# Patient Record
Sex: Female | Born: 1955 | Race: White | Hispanic: No | Marital: Married | Smoking: Never smoker
Health system: Southern US, Community
[De-identification: ages and names within clinical notes are randomized; demographics above are authoritative.]

## PROBLEM LIST (undated history)

## (undated) DIAGNOSIS — Z973 Presence of spectacles and contact lenses: Secondary | ICD-10-CM

## (undated) DIAGNOSIS — R350 Frequency of micturition: Secondary | ICD-10-CM

## (undated) DIAGNOSIS — R51 Headache: Secondary | ICD-10-CM

## (undated) DIAGNOSIS — C801 Malignant (primary) neoplasm, unspecified: Secondary | ICD-10-CM

## (undated) DIAGNOSIS — Z8489 Family history of other specified conditions: Secondary | ICD-10-CM

## (undated) DIAGNOSIS — F32A Depression, unspecified: Secondary | ICD-10-CM

## (undated) DIAGNOSIS — M1990 Unspecified osteoarthritis, unspecified site: Secondary | ICD-10-CM

## (undated) DIAGNOSIS — G2581 Restless legs syndrome: Secondary | ICD-10-CM

## (undated) DIAGNOSIS — N289 Disorder of kidney and ureter, unspecified: Secondary | ICD-10-CM

## (undated) DIAGNOSIS — F319 Bipolar disorder, unspecified: Secondary | ICD-10-CM

## (undated) DIAGNOSIS — Z8701 Personal history of pneumonia (recurrent): Secondary | ICD-10-CM

## (undated) DIAGNOSIS — Z98811 Dental restoration status: Secondary | ICD-10-CM

## (undated) DIAGNOSIS — N390 Urinary tract infection, site not specified: Secondary | ICD-10-CM

## (undated) DIAGNOSIS — F329 Major depressive disorder, single episode, unspecified: Secondary | ICD-10-CM

## (undated) DIAGNOSIS — K219 Gastro-esophageal reflux disease without esophagitis: Secondary | ICD-10-CM

## (undated) DIAGNOSIS — R519 Headache, unspecified: Secondary | ICD-10-CM

## (undated) DIAGNOSIS — F988 Other specified behavioral and emotional disorders with onset usually occurring in childhood and adolescence: Secondary | ICD-10-CM

## (undated) DIAGNOSIS — F419 Anxiety disorder, unspecified: Secondary | ICD-10-CM

## (undated) HISTORY — PX: MOHS SURGERY: SUR867

---

## 2015-04-17 HISTORY — PX: EYE SURGERY: SHX253

## 2015-05-22 ENCOUNTER — Other Ambulatory Visit: Payer: Self-pay | Admitting: Neurosurgery

## 2015-05-22 ENCOUNTER — Ambulatory Visit
Admission: RE | Admit: 2015-05-22 | Discharge: 2015-05-22 | Disposition: A | Discharge status: Home / Self Care | Payer: BC Managed Care – PPO | Source: Ambulatory Visit | Attending: Neurosurgery | Admitting: Neurosurgery

## 2015-05-22 DIAGNOSIS — M4317 Spondylolisthesis, lumbosacral region: Secondary | ICD-10-CM

## 2015-05-22 MED ORDER — IOHEXOL 180 MG/ML  SOLN
18.0000 mL | Freq: Once | INTRAMUSCULAR | Status: AC | PRN
  Administered 2015-05-22: 18 mL via INTRATHECAL

## 2015-05-22 MED ORDER — DIAZEPAM 5 MG PO TABS
10.0000 mg | ORAL_TABLET | Freq: Once | ORAL | Status: AC
  Administered 2015-05-22: 10 mg via ORAL

## 2015-05-22 NOTE — Progress Notes (Signed)
Patient states she has been off Adderall for at least the past two days.  jkl

## 2015-05-22 NOTE — Discharge Instructions (Signed)
Myelogram Discharge Instructions  1. Go home and rest quietly for the next 24 hours.  It is important to lie flat for the next 24 hours.  Get up only to go to the restroom.  You may lie in the bed or on a couch on your back, your stomach, your left side or your right side.  You may have one pillow under your head.  You may have pillows between your knees while you are on your side or under your knees while you are on your back.  2. DO NOT drive today.  Recline the seat as far back as it will go, while still wearing your seat belt, on the way home.  3. You may get up to go to the bathroom as needed.  You may sit up for 10 minutes to eat.  You may resume your normal diet and medications unless otherwise indicated.  Drink plenty of extra fluids today and tomorrow.  4. The incidence of a spinal headache with nausea and/or vomiting is about 5% (one in 20 patients).  If you develop a headache, lie flat and drink plenty of fluids until the headache goes away.  Caffeinated beverages may be helpful.  If you develop severe nausea and vomiting or a headache that does not go away with flat bed rest, call [phone].  5. You may resume normal activities after your 24 hours of bed rest is over; however, do not exert yourself strongly or do any heavy lifting tomorrow.  6. Call your physician for a follow-up appointment.   You may resume Adderall on Saturday, May 23, 2015 after 11:00am.

## 2015-05-26 ENCOUNTER — Other Ambulatory Visit: Payer: Self-pay | Admitting: Neurosurgery

## 2015-05-28 NOTE — Pre-Procedure Instructions (Signed)
    Kelly Decker  05/28/2015      [id number] - [place], [place] [address] [place] [place] [address] Phone: [phone] Fax: [phone]  [place], [address] [place] [address] Phone: [phone] Fax: [phone]    Your procedure is scheduled on Tuesday, August 16th, 2016.   Report to [facility] Admitting at 10:00 A.M.  Call this number if you have problems the morning of surgery:  [phone]   Remember:  Do not eat food or drink liquids after midnight.   Take these medicines the morning of surgery with A SIP OF WATER: Amphetamine-dextroamphetamine (Adderall), clonazepam (Klonopin), Omeprazole (Prilosec).   Stop taking: NSAIDS, Aspirin, Aleve, Naproxen, Ibuprofen, BC's, Goody's, fish oil, all herbal medications, and all vitamins.    Do not wear jewelry, make-up or nail polish.  Do not wear lotions, powders, or perfumes.  You may wear deodorant.  Do not shave 48 hours prior to surgery.    Do not bring valuables to the hospital.  [facility] is not responsible for any belongings or valuables.  Contacts, dentures or bridgework may not be worn into surgery.  Leave your suitcase in the car.  After surgery it may be brought to your room.  For patients admitted to the hospital, discharge time will be determined by your treatment team.  Patients discharged the day of surgery will not be allowed to drive home.   Special instructions:  See attached.   Please read over the following fact sheets that you were given. Pain Booklet, Coughing and Deep Breathing, Blood Transfusion Information, MRSA Information and Surgical Site Infection Prevention

## 2015-05-29 ENCOUNTER — Encounter (HOSPITAL_COMMUNITY)
Admission: RE | Admit: 2015-05-29 | Discharge: 2015-05-29 | Disposition: A | Discharge status: Home / Self Care | Payer: BC Managed Care – PPO | Source: Ambulatory Visit | Attending: Neurosurgery | Admitting: Neurosurgery

## 2015-05-29 ENCOUNTER — Encounter (HOSPITAL_COMMUNITY): Payer: Self-pay

## 2015-05-29 DIAGNOSIS — M4317 Spondylolisthesis, lumbosacral region: Secondary | ICD-10-CM | POA: Insufficient documentation

## 2015-05-29 DIAGNOSIS — Z0183 Encounter for blood typing: Secondary | ICD-10-CM | POA: Diagnosis not present

## 2015-05-29 DIAGNOSIS — Z01812 Encounter for preprocedural laboratory examination: Secondary | ICD-10-CM | POA: Insufficient documentation

## 2015-05-29 HISTORY — DX: Headache, unspecified: R51.9

## 2015-05-29 HISTORY — DX: Headache: R51

## 2015-05-29 HISTORY — DX: Malignant (primary) neoplasm, unspecified: C80.1

## 2015-05-29 HISTORY — DX: Major depressive disorder, single episode, unspecified: F32.9

## 2015-05-29 HISTORY — DX: Restless legs syndrome: G25.81

## 2015-05-29 HISTORY — DX: Anxiety disorder, unspecified: F41.9

## 2015-05-29 HISTORY — DX: Family history of other specified conditions: Z84.89

## 2015-05-29 HISTORY — DX: Dental restoration status: Z98.811

## 2015-05-29 HISTORY — DX: Frequency of micturition: R35.0

## 2015-05-29 HISTORY — DX: Presence of spectacles and contact lenses: Z97.3

## 2015-05-29 HISTORY — DX: Unspecified osteoarthritis, unspecified site: M19.90

## 2015-05-29 HISTORY — DX: Depression, unspecified: F32.A

## 2015-05-29 HISTORY — DX: Other specified behavioral and emotional disorders with onset usually occurring in childhood and adolescence: F98.8

## 2015-05-29 HISTORY — DX: Bipolar disorder, unspecified: F31.9

## 2015-05-29 HISTORY — DX: Personal history of pneumonia (recurrent): Z87.01

## 2015-05-29 HISTORY — DX: Gastro-esophageal reflux disease without esophagitis: K21.9

## 2015-05-29 HISTORY — DX: Urinary tract infection, site not specified: N39.0

## 2015-05-29 HISTORY — DX: Disorder of kidney and ureter, unspecified: N28.9

## 2015-05-29 LAB — COMPREHENSIVE METABOLIC PANEL
ALT: 20 U/L (ref 14–54)
ANION GAP: 7 (ref 5–15)
AST: 28 U/L (ref 15–41)
Albumin: 4.2 g/dL (ref 3.5–5.0)
Alkaline Phosphatase: 104 U/L (ref 38–126)
BUN: 15 mg/dL (ref 6–20)
CALCIUM: 9.2 mg/dL (ref 8.9–10.3)
CO2: 26 mmol/L (ref 22–32)
CREATININE: 0.87 mg/dL (ref 0.44–1.00)
Chloride: 107 mmol/L (ref 101–111)
GFR calc Af Amer: >60 mL/min (ref >60)
GFR calc non Af Amer: >60 mL/min (ref >60)
Glucose, Bld: 91 mg/dL (ref 65–99)
Potassium: 4.2 mmol/L (ref 3.5–5.1)
SODIUM: 140 mmol/L (ref 135–145)
TOTAL PROTEIN: 6.9 g/dL (ref 6.5–8.1)
Total Bilirubin: 1 mg/dL (ref 0.3–1.2)

## 2015-05-29 LAB — SURGICAL PCR SCREEN
MRSA, PCR: NEGATIVE
Staphylococcus aureus: NEGATIVE

## 2015-05-29 LAB — TYPE AND SCREEN
ABO/RH(D): O NEG
ANTIBODY SCREEN: NEGATIVE

## 2015-05-29 LAB — ABO/RH: ABO/RH(D): O NEG

## 2015-05-29 LAB — CBC
HEMATOCRIT: 42.1 % (ref 36.0–46.0)
HEMOGLOBIN: 13.8 g/dL (ref 12.0–15.0)
MCH: 29.6 pg (ref 26.0–34.0)
MCHC: 32.8 g/dL (ref 30.0–36.0)
MCV: 90.1 fL (ref 78.0–100.0)
PLATELETS: 347 10*3/uL (ref 150–400)
RBC: 4.67 MIL/uL (ref 3.87–5.11)
RDW: 14.5 % (ref 11.5–15.5)
WBC: 9 10*3/uL (ref 4.0–10.5)

## 2015-05-29 NOTE — Progress Notes (Signed)
PCP- Dr. Ashok Croon Cardio- denies EKG & CXR - denies Echo/Stress Test/Cardiac Cath - denies  Pt. Denies shortness of breath and any cardiac issues at PAT appointment.

## 2015-06-01 MED ORDER — VANCOMYCIN HCL IN DEXTROSE 1-5 GM/200ML-% IV SOLN
1000.0000 mg | INTRAVENOUS | Status: AC
  Administered 2015-06-02: 1000 mg via INTRAVENOUS
  Filled 2015-06-01: qty 200

## 2015-06-01 NOTE — H&P (Signed)
Kelly Decker is an 59 y.o. female.   Chief Complaint: right leg pain HPI: patient was seen in my office along with her husband because of lumbar pain with radiation to thr right leg, the pain is a burnning sensation ,constant, no better with medications.  Past Medical History  Diagnosis Date  . Dental crown present   . ADD (attention deficit disorder)   . Bipolar disorder   . Anxiety   . Restless leg syndrome   . GERD (gastroesophageal reflux disease)   . Ureteral disorder     as a child  . Recurrent UTI   . History of pneumonia   . Wears glasses   . Depression   . Urinary frequency   . Headache   . Family history of headaches   . Arthritis   . Cancer     right leg - skin cancer    Past Surgical History  Procedure Laterality Date  . Mohs surgery Right     right leg  . Eye surgery Left July 2016    Lens Implant with Femtosecond left eye; Intraocular Lens Implant    No family history on file. Social History:  reports that she has never smoked. She does not have any smokeless tobacco history on file. She reports that she drinks alcohol. She reports that she does not use illicit drugs.  Allergies:  Allergies  Allergen Reactions  . Other Other (See Comments)    Novacaine at the dentist "caused my heart rate to drop and made me pass out."  My dentist told me never to use this again.  05/22/15: Lidocaine was used during myelogram without incident.  . Procaine Other (See Comments)    Other reaction(s): Other VERY SLOW HEART RATE  . Tetanus Toxoids Other (See Comments)    Decreased heart rate Passed out  . Ciprofloxacin Hives and Rash    hives  . Gabapentin Hives and Rash  . Penicillins Hives and Rash  . Sulfa Antibiotics Rash and Hives  . Codeine Nausea Only  . Lac Bovis Diarrhea    ABDOMINAL PAIN    No prescriptions prior to admission    No results found for this or any previous visit (from the past 48 hour(s)). No results found.  Review of Systems   Constitutional: Negative.   HENT: Negative.   Eyes: Positive for blurred vision.  Respiratory: Negative.   Cardiovascular: Negative.   Gastrointestinal: Negative.   Genitourinary: Negative.   Musculoskeletal: Positive for back pain.  Skin: Negative.   Neurological: Positive for sensory change and focal weakness.  Endo/Heme/Allergies: Negative.   Psychiatric/Behavioral: Positive for depression.    There were no vitals taken for this visit. Physical Exam  Henet, nl. Neck, nlungs.clear cv, nl. Abdomen, nl. Extremities, nl. NEURO weakness of DF right foot. Sensory with burning feelings at l5-s1 dermatomes. Femoral stretch positive on the right. Dtr one plus. myrlo gram shows spondylolisthesis grade2 at l5s1 and at l4-5 spondylolisthesis up to 8 mms. Assessment/Plan Decompression and fusion at l45,l5s1 with cages and pedicle crews. Both are aware of risks and benefits  Renny Remer M 06/01/2015, 4:48 PM

## 2015-06-02 ENCOUNTER — Inpatient Hospital Stay (HOSPITAL_COMMUNITY): Payer: BC Managed Care – PPO

## 2015-06-02 ENCOUNTER — Inpatient Hospital Stay (HOSPITAL_COMMUNITY): Payer: BC Managed Care – PPO | Admitting: Anesthesiology

## 2015-06-02 ENCOUNTER — Encounter (HOSPITAL_COMMUNITY): Payer: Self-pay | Admitting: General Practice

## 2015-06-02 ENCOUNTER — Inpatient Hospital Stay (HOSPITAL_COMMUNITY)
Admission: AD | Admit: 2015-06-02 | Discharge: 2015-06-06 | DRG: 460 | Disposition: A | Discharge status: Home Health | Payer: BC Managed Care – PPO | Source: Ambulatory Visit | Attending: Neurosurgery | Admitting: Neurosurgery

## 2015-06-02 ENCOUNTER — Encounter (HOSPITAL_COMMUNITY)
Admission: AD | Disposition: A | Discharge status: Home Health | Payer: BC Managed Care – PPO | Source: Ambulatory Visit | Attending: Neurosurgery

## 2015-06-02 DIAGNOSIS — M4316 Spondylolisthesis, lumbar region: Secondary | ICD-10-CM | POA: Diagnosis present

## 2015-06-02 DIAGNOSIS — F988 Other specified behavioral and emotional disorders with onset usually occurring in childhood and adolescence: Secondary | ICD-10-CM | POA: Diagnosis present

## 2015-06-02 DIAGNOSIS — Z885 Allergy status to narcotic agent status: Secondary | ICD-10-CM | POA: Diagnosis not present

## 2015-06-02 DIAGNOSIS — F319 Bipolar disorder, unspecified: Secondary | ICD-10-CM | POA: Diagnosis present

## 2015-06-02 DIAGNOSIS — Z887 Allergy status to serum and vaccine status: Secondary | ICD-10-CM | POA: Diagnosis not present

## 2015-06-02 DIAGNOSIS — Q762 Congenital spondylolisthesis: Secondary | ICD-10-CM | POA: Diagnosis not present

## 2015-06-02 DIAGNOSIS — F419 Anxiety disorder, unspecified: Secondary | ICD-10-CM | POA: Diagnosis present

## 2015-06-02 DIAGNOSIS — K219 Gastro-esophageal reflux disease without esophagitis: Secondary | ICD-10-CM | POA: Diagnosis present

## 2015-06-02 DIAGNOSIS — M79604 Pain in right leg: Secondary | ICD-10-CM | POA: Diagnosis present

## 2015-06-02 DIAGNOSIS — Z882 Allergy status to sulfonamides status: Secondary | ICD-10-CM

## 2015-06-02 DIAGNOSIS — M532X7 Spinal instabilities, lumbosacral region: Secondary | ICD-10-CM | POA: Diagnosis present

## 2015-06-02 DIAGNOSIS — M5417 Radiculopathy, lumbosacral region: Secondary | ICD-10-CM | POA: Diagnosis present

## 2015-06-02 DIAGNOSIS — Z419 Encounter for procedure for purposes other than remedying health state, unspecified: Secondary | ICD-10-CM

## 2015-06-02 DIAGNOSIS — Z880 Allergy status to penicillin: Secondary | ICD-10-CM | POA: Diagnosis not present

## 2015-06-02 DIAGNOSIS — Z888 Allergy status to other drugs, medicaments and biological substances status: Secondary | ICD-10-CM

## 2015-06-02 DIAGNOSIS — G2581 Restless legs syndrome: Secondary | ICD-10-CM | POA: Diagnosis present

## 2015-06-02 SURGERY — POSTERIOR LUMBAR FUSION 2 LEVEL
Anesthesia: General

## 2015-06-02 MED ORDER — FENTANYL CITRATE (PF) 100 MCG/2ML IJ SOLN
INTRAMUSCULAR | Status: DC | PRN
  Administered 2015-06-02 (×2): 50 ug via INTRAVENOUS
  Administered 2015-06-02: 150 ug via INTRAVENOUS
  Administered 2015-06-02: 50 ug via INTRAVENOUS
  Administered 2015-06-02: 100 ug via INTRAVENOUS
  Administered 2015-06-02: 250 ug via INTRAVENOUS

## 2015-06-02 MED ORDER — MEPERIDINE HCL 25 MG/ML IJ SOLN: 6.2500 mg | INTRAMUSCULAR | Status: DC | PRN

## 2015-06-02 MED ORDER — ACETAMINOPHEN 325 MG PO TABS
650.0000 mg | ORAL_TABLET | ORAL | Status: DC | PRN
  Administered 2015-06-05 – 2015-06-06 (×7): 650 mg via ORAL
  Filled 2015-06-02 (×8): qty 2

## 2015-06-02 MED ORDER — MORPHINE SULFATE 1 MG/ML IV SOLN
INTRAVENOUS | Status: AC
  Filled 2015-06-02: qty 25

## 2015-06-02 MED ORDER — HYDROMORPHONE HCL 1 MG/ML IJ SOLN
INTRAMUSCULAR | Status: AC
  Administered 2015-06-02: 0.25 mg via INTRAVENOUS
  Filled 2015-06-02: qty 1

## 2015-06-02 MED ORDER — MECLIZINE HCL 12.5 MG PO TABS
12.5000 mg | ORAL_TABLET | Freq: Once | ORAL | Status: AC
  Administered 2015-06-02: 12.5 mg via ORAL
  Filled 2015-06-02 (×2): qty 1

## 2015-06-02 MED ORDER — SODIUM CHLORIDE 0.9 % IV SOLN: 250.0000 mL | INTRAVENOUS | Status: DC

## 2015-06-02 MED ORDER — GLYCOPYRROLATE 0.2 MG/ML IJ SOLN
INTRAMUSCULAR | Status: AC
  Filled 2015-06-02: qty 2

## 2015-06-02 MED ORDER — DEXAMETHASONE SODIUM PHOSPHATE 10 MG/ML IJ SOLN
INTRAMUSCULAR | Status: DC | PRN
  Administered 2015-06-02: 10 mg via INTRAVENOUS

## 2015-06-02 MED ORDER — NALOXONE HCL 0.4 MG/ML IJ SOLN
INTRAMUSCULAR | Status: AC
  Filled 2015-06-02: qty 1

## 2015-06-02 MED ORDER — ONDANSETRON HCL 4 MG/2ML IJ SOLN
INTRAMUSCULAR | Status: DC | PRN
  Administered 2015-06-02: 4 mg via INTRAVENOUS

## 2015-06-02 MED ORDER — OXYCODONE-ACETAMINOPHEN 5-325 MG PO TABS
2.0000 | ORAL_TABLET | ORAL | Status: DC | PRN
  Administered 2015-06-02 – 2015-06-03 (×5): 2 via ORAL
  Administered 2015-06-04: 1 via ORAL
  Filled 2015-06-02 (×6): qty 2

## 2015-06-02 MED ORDER — AMPHETAMINE-DEXTROAMPHETAMINE 10 MG PO TABS
30.0000 mg | ORAL_TABLET | Freq: Every day | ORAL | Status: DC
  Administered 2015-06-06: 30 mg via ORAL
  Filled 2015-06-02 (×3): qty 3

## 2015-06-02 MED ORDER — SCOPOLAMINE 1 MG/3DAYS TD PT72
1.0000 | MEDICATED_PATCH | Freq: Once | TRANSDERMAL | Status: AC
  Administered 2015-06-02: 1.5 mg via TRANSDERMAL
  Filled 2015-06-02: qty 1

## 2015-06-02 MED ORDER — DIAZEPAM 5 MG PO TABS
5.0000 mg | ORAL_TABLET | Freq: Four times a day (QID) | ORAL | Status: DC | PRN
  Administered 2015-06-04: 5 mg via ORAL
  Filled 2015-06-02: qty 1

## 2015-06-02 MED ORDER — AMPHETAMINE-DEXTROAMPHETAMINE 30 MG PO TABS: 30.0000 mg | ORAL_TABLET | Freq: Every day | ORAL | Status: DC

## 2015-06-02 MED ORDER — PROPOFOL 10 MG/ML IV BOLUS
INTRAVENOUS | Status: AC
  Filled 2015-06-02: qty 20

## 2015-06-02 MED ORDER — HYDROMORPHONE HCL 1 MG/ML IJ SOLN
0.2500 mg | INTRAMUSCULAR | Status: DC | PRN
  Administered 2015-06-02: 0.25 mg via INTRAVENOUS

## 2015-06-02 MED ORDER — PROMETHAZINE HCL 25 MG/ML IJ SOLN
6.2500 mg | INTRAMUSCULAR | Status: DC | PRN
Start: 2015-06-02 — End: 2015-06-02

## 2015-06-02 MED ORDER — PHENYLEPHRINE HCL 10 MG/ML IJ SOLN
10.0000 mg | INTRAVENOUS | Status: DC | PRN
  Administered 2015-06-02: 10 ug/min via INTRAVENOUS

## 2015-06-02 MED ORDER — FENTANYL CITRATE (PF) 250 MCG/5ML IJ SOLN
INTRAMUSCULAR | Status: AC
  Filled 2015-06-02: qty 5

## 2015-06-02 MED ORDER — NEOSTIGMINE METHYLSULFATE 10 MG/10ML IV SOLN
INTRAVENOUS | Status: DC | PRN
  Administered 2015-06-02: 3 mg via INTRAVENOUS

## 2015-06-02 MED ORDER — PHENYLEPHRINE HCL 10 MG/ML IJ SOLN
INTRAMUSCULAR | Status: DC | PRN
  Administered 2015-06-02 (×2): 40 ug via INTRAVENOUS
  Administered 2015-06-02 (×2): 80 ug via INTRAVENOUS
  Administered 2015-06-02: 40 ug via INTRAVENOUS
  Administered 2015-06-02: 80 ug via INTRAVENOUS
  Administered 2015-06-02: 40 ug via INTRAVENOUS

## 2015-06-02 MED ORDER — SCOPOLAMINE 1 MG/3DAYS TD PT72
MEDICATED_PATCH | TRANSDERMAL | Status: AC
Start: 2015-06-02 — End: 2015-06-02
  Administered 2015-06-02: 1 via TRANSDERMAL
  Filled 2015-06-02: qty 1

## 2015-06-02 MED ORDER — PROPOFOL 10 MG/ML IV BOLUS
INTRAVENOUS | Status: DC | PRN
  Administered 2015-06-02: 150 mg via INTRAVENOUS

## 2015-06-02 MED ORDER — ROCURONIUM BROMIDE 50 MG/5ML IV SOLN
INTRAVENOUS | Status: AC
  Filled 2015-06-02: qty 1

## 2015-06-02 MED ORDER — DIPHENHYDRAMINE HCL 50 MG/ML IJ SOLN
12.5000 mg | Freq: Four times a day (QID) | INTRAMUSCULAR | Status: DC | PRN
  Filled 2015-06-02: qty 0.25

## 2015-06-02 MED ORDER — ROCURONIUM BROMIDE 100 MG/10ML IV SOLN
INTRAVENOUS | Status: DC | PRN
  Administered 2015-06-02: 40 mg via INTRAVENOUS

## 2015-06-02 MED ORDER — LACTATED RINGERS IV SOLN
INTRAVENOUS | Status: DC | PRN
  Administered 2015-06-02 (×3): via INTRAVENOUS

## 2015-06-02 MED ORDER — VANCOMYCIN HCL 1000 MG IV SOLR
INTRAVENOUS | Status: AC
  Filled 2015-06-02: qty 2000

## 2015-06-02 MED ORDER — MORPHINE SULFATE 1 MG/ML IV SOLN
INTRAVENOUS | Status: DC
  Administered 2015-06-03 – 2015-06-04 (×3): 0 mg via INTRAVENOUS

## 2015-06-02 MED ORDER — ONDANSETRON HCL 4 MG/2ML IJ SOLN: 4.0000 mg | INTRAMUSCULAR | Status: DC | PRN

## 2015-06-02 MED ORDER — ONDANSETRON HCL 4 MG/2ML IJ SOLN
4.0000 mg | Freq: Four times a day (QID) | INTRAMUSCULAR | Status: DC | PRN
  Filled 2015-06-02: qty 2

## 2015-06-02 MED ORDER — THROMBIN 5000 UNITS EX SOLR
OROMUCOSAL | Status: DC | PRN
  Administered 2015-06-02: 15:00:00 via TOPICAL

## 2015-06-02 MED ORDER — SODIUM CHLORIDE 0.9 % IJ SOLN: 9.0000 mL | INTRAMUSCULAR | Status: DC | PRN

## 2015-06-02 MED ORDER — BUPIVACAINE LIPOSOME 1.3 % IJ SUSP
INTRAMUSCULAR | Status: DC | PRN
  Administered 2015-06-02: 20 mL

## 2015-06-02 MED ORDER — PRAMIPEXOLE DIHYDROCHLORIDE 1.5 MG PO TABS
1.5000 mg | ORAL_TABLET | Freq: Every day | ORAL | Status: DC
  Administered 2015-06-03 – 2015-06-05 (×4): 1.5 mg via ORAL
  Filled 2015-06-02 (×6): qty 1

## 2015-06-02 MED ORDER — VANCOMYCIN HCL 1000 MG IV SOLR
INTRAVENOUS | Status: DC | PRN
  Administered 2015-06-02 (×2): 1000 mg via TOPICAL

## 2015-06-02 MED ORDER — DIPHENHYDRAMINE HCL 50 MG/ML IJ SOLN
INTRAMUSCULAR | Status: DC | PRN
  Administered 2015-06-02: 25 mg via INTRAVENOUS

## 2015-06-02 MED ORDER — GLYCOPYRROLATE 0.2 MG/ML IJ SOLN
INTRAMUSCULAR | Status: DC | PRN
  Administered 2015-06-02: 0.4 mg via INTRAVENOUS

## 2015-06-02 MED ORDER — SODIUM CHLORIDE 0.9 % IJ SOLN
3.0000 mL | Freq: Two times a day (BID) | INTRAMUSCULAR | Status: DC
  Administered 2015-06-04 – 2015-06-05 (×2): 3 mL via INTRAVENOUS

## 2015-06-02 MED ORDER — THROMBIN 20000 UNITS EX SOLR
CUTANEOUS | Status: DC | PRN
  Administered 2015-06-02: 15:00:00 via TOPICAL

## 2015-06-02 MED ORDER — PHENOL 1.4 % MT LIQD
1.0000 | OROMUCOSAL | Status: DC | PRN
  Filled 2015-06-02: qty 177

## 2015-06-02 MED ORDER — LIDOCAINE HCL (CARDIAC) 20 MG/ML IV SOLN
INTRAVENOUS | Status: DC | PRN
  Administered 2015-06-02: 80 mg via INTRAVENOUS

## 2015-06-02 MED ORDER — LACTATED RINGERS IV SOLN: INTRAVENOUS | Status: DC

## 2015-06-02 MED ORDER — MENTHOL 3 MG MT LOZG
1.0000 | LOZENGE | OROMUCOSAL | Status: DC | PRN
  Filled 2015-06-02: qty 9

## 2015-06-02 MED ORDER — MIDAZOLAM HCL 2 MG/2ML IJ SOLN
INTRAMUSCULAR | Status: AC
  Filled 2015-06-02: qty 4

## 2015-06-02 MED ORDER — LIDOCAINE HCL (CARDIAC) 20 MG/ML IV SOLN
INTRAVENOUS | Status: AC
  Filled 2015-06-02: qty 5

## 2015-06-02 MED ORDER — LACTATED RINGERS IV SOLN
INTRAVENOUS | Status: DC
  Administered 2015-06-02: 11:00:00 via INTRAVENOUS

## 2015-06-02 MED ORDER — SUCCINYLCHOLINE CHLORIDE 20 MG/ML IJ SOLN
INTRAMUSCULAR | Status: AC
  Filled 2015-06-02: qty 1

## 2015-06-02 MED ORDER — SODIUM CHLORIDE 0.9 % IJ SOLN: 3.0000 mL | INTRAMUSCULAR | Status: DC | PRN

## 2015-06-02 MED ORDER — BUPIVACAINE LIPOSOME 1.3 % IJ SUSP
20.0000 mL | Freq: Once | INTRAMUSCULAR | Status: DC
  Filled 2015-06-02: qty 20

## 2015-06-02 MED ORDER — LIDOCAINE HCL (CARDIAC) 20 MG/ML IV SOLN
INTRAVENOUS | Status: AC
Start: 2015-06-02 — End: 2015-06-02
  Filled 2015-06-02: qty 5

## 2015-06-02 MED ORDER — MIDAZOLAM HCL 2 MG/2ML IJ SOLN
INTRAMUSCULAR | Status: AC
Start: 2015-06-02 — End: 2015-06-02
  Filled 2015-06-02: qty 4

## 2015-06-02 MED ORDER — VANCOMYCIN HCL IN DEXTROSE 750-5 MG/150ML-% IV SOLN
750.0000 mg | Freq: Two times a day (BID) | INTRAVENOUS | Status: DC
  Administered 2015-06-03 – 2015-06-06 (×7): 750 mg via INTRAVENOUS
  Filled 2015-06-02 (×9): qty 150

## 2015-06-02 MED ORDER — ONDANSETRON HCL 4 MG/2ML IJ SOLN
INTRAMUSCULAR | Status: AC
  Filled 2015-06-02: qty 2

## 2015-06-02 MED ORDER — CLONAZEPAM 1 MG PO TABS
1.0000 mg | ORAL_TABLET | Freq: Every day | ORAL | Status: DC
  Administered 2015-06-02 – 2015-06-05 (×4): 1 mg via ORAL
  Filled 2015-06-02 (×5): qty 1

## 2015-06-02 MED ORDER — DIPHENHYDRAMINE HCL 12.5 MG/5ML PO ELIX
12.5000 mg | ORAL_SOLUTION | Freq: Four times a day (QID) | ORAL | Status: DC | PRN
  Filled 2015-06-02: qty 5

## 2015-06-02 MED ORDER — 0.9 % SODIUM CHLORIDE (POUR BTL) OPTIME
TOPICAL | Status: DC | PRN
  Administered 2015-06-02: 1000 mL

## 2015-06-02 MED ORDER — ACETAMINOPHEN 10 MG/ML IV SOLN
INTRAVENOUS | Status: AC
Start: 2015-06-02 — End: 2015-06-02
  Administered 2015-06-02: 1000 mg via INTRAVENOUS
  Filled 2015-06-02: qty 100

## 2015-06-02 MED ORDER — ACETAMINOPHEN 650 MG RE SUPP: 650.0000 mg | RECTAL | Status: DC | PRN

## 2015-06-02 MED ORDER — MIDAZOLAM HCL 5 MG/5ML IJ SOLN
INTRAMUSCULAR | Status: DC | PRN
  Administered 2015-06-02: 2 mg via INTRAVENOUS

## 2015-06-02 MED ORDER — NALOXONE HCL 0.4 MG/ML IJ SOLN
0.4000 mg | INTRAMUSCULAR | Status: DC | PRN
  Filled 2015-06-02: qty 1

## 2015-06-02 MED ORDER — SODIUM CHLORIDE 0.9 % IV SOLN
INTRAVENOUS | Status: DC
  Administered 2015-06-02: 75 mL/h via INTRAVENOUS

## 2015-06-02 SURGICAL SUPPLY — 69 items
BENZOIN TINCTURE PRP APPL 2/3 (GAUZE/BANDAGES/DRESSINGS) ×2 IMPLANT
BLADE CLIPPER SURG (BLADE) IMPLANT
BUR ACORN 6.0 (BURR) ×2 IMPLANT
BUR MATCHSTICK NEURO 3.0 LAGG (BURR) ×2 IMPLANT
CANISTER SUCT 3000ML PPV (MISCELLANEOUS) ×2 IMPLANT
CAP LOCKING THREADED (Cap) ×8 IMPLANT
CONT SPEC 4OZ CLIKSEAL STRL BL (MISCELLANEOUS) ×2 IMPLANT
COVER BACK TABLE 60X90IN (DRAPES) ×2 IMPLANT
CROSSLINK SPINAL FUSION (Cage) ×2 IMPLANT
DRAPE C-ARM 42X72 X-RAY (DRAPES) ×6 IMPLANT
DRAPE LAPAROTOMY 100X72X124 (DRAPES) ×2 IMPLANT
DRAPE POUCH INSTRU U-SHP 10X18 (DRAPES) ×2 IMPLANT
DRSG OPSITE POSTOP 4X6 (GAUZE/BANDAGES/DRESSINGS) ×2 IMPLANT
DRSG PAD ABDOMINAL 8X10 ST (GAUZE/BANDAGES/DRESSINGS) IMPLANT
DURAPREP 26ML APPLICATOR (WOUND CARE) ×2 IMPLANT
ELECT BLADE 4.0 EZ CLEAN MEGAD (MISCELLANEOUS) ×2
ELECT REM PT RETURN 9FT ADLT (ELECTROSURGICAL) ×2
ELECTRODE BLDE 4.0 EZ CLN MEGD (MISCELLANEOUS) ×1 IMPLANT
ELECTRODE REM PT RTRN 9FT ADLT (ELECTROSURGICAL) ×1 IMPLANT
EVACUATOR 1/8 PVC DRAIN (DRAIN) IMPLANT
EVACUATOR 3/16  PVC DRAIN (DRAIN) ×1
EVACUATOR 3/16 PVC DRAIN (DRAIN) ×1 IMPLANT
GAUZE SPONGE 4X4 12PLY STRL (GAUZE/BANDAGES/DRESSINGS) ×2 IMPLANT
GAUZE SPONGE 4X4 16PLY XRAY LF (GAUZE/BANDAGES/DRESSINGS) ×4 IMPLANT
GLOVE BIOGEL M 8.0 STRL (GLOVE) ×6 IMPLANT
GLOVE EXAM NITRILE LRG STRL (GLOVE) IMPLANT
GLOVE EXAM NITRILE MD LF STRL (GLOVE) IMPLANT
GLOVE EXAM NITRILE XL STR (GLOVE) IMPLANT
GLOVE EXAM NITRILE XS STR PU (GLOVE) IMPLANT
GOWN STRL REUS W/ TWL LRG LVL3 (GOWN DISPOSABLE) ×1 IMPLANT
GOWN STRL REUS W/ TWL XL LVL3 (GOWN DISPOSABLE) ×3 IMPLANT
GOWN STRL REUS W/TWL 2XL LVL3 (GOWN DISPOSABLE) IMPLANT
GOWN STRL REUS W/TWL LRG LVL3 (GOWN DISPOSABLE) ×1
GOWN STRL REUS W/TWL XL LVL3 (GOWN DISPOSABLE) ×3
KIT BASIN OR (CUSTOM PROCEDURE TRAY) ×2 IMPLANT
KIT INFUSE LRG II (Orthopedic Implant) ×2 IMPLANT
KIT ROOM TURNOVER OR (KITS) ×2 IMPLANT
MILL MEDIUM DISP (BLADE) ×2 IMPLANT
NEEDLE HYPO 18GX1.5 BLUNT FILL (NEEDLE) IMPLANT
NEEDLE HYPO 21X1.5 SAFETY (NEEDLE) ×2 IMPLANT
NEEDLE HYPO 25X1 1.5 SAFETY (NEEDLE) IMPLANT
NS IRRIG 1000ML POUR BTL (IV SOLUTION) ×2 IMPLANT
PACK FOAM VITOSS 10CC (Orthopedic Implant) ×4 IMPLANT
PACK LAMINECTOMY NEURO (CUSTOM PROCEDURE TRAY) ×2 IMPLANT
PAD ARMBOARD 7.5X6 YLW CONV (MISCELLANEOUS) ×6 IMPLANT
PATTIES SURGICAL .5 X1 (DISPOSABLE) ×2 IMPLANT
PATTIES SURGICAL .5 X3 (DISPOSABLE) IMPLANT
PATTIES SURGICAL 1X1 (DISPOSABLE) ×2 IMPLANT
ROD 40MM SPINAL (Rod) ×2 IMPLANT
ROD SPINAL 35MM (Rod) ×2 IMPLANT
SCREW CREO THREADED 5.5X45MM (Screw) ×4 IMPLANT
SCREW SPINE 40X5.5XPA CREO (Screw) ×2 IMPLANT
SCREW SPINE CREO 5.5X40 (Screw) ×2 IMPLANT
SPACER RISE 8X22 11-17MM-15 (Spacer) ×4 IMPLANT
SPONGE LAP 4X18 X RAY DECT (DISPOSABLE) IMPLANT
SPONGE NEURO XRAY DETECT 1X3 (DISPOSABLE) IMPLANT
SPONGE SURGIFOAM ABS GEL 100 (HEMOSTASIS) ×2 IMPLANT
STRIP CLOSURE SKIN 1/2X4 (GAUZE/BANDAGES/DRESSINGS) ×2 IMPLANT
SUT VIC AB 1 CT1 18XBRD ANBCTR (SUTURE) ×2 IMPLANT
SUT VIC AB 1 CT1 8-18 (SUTURE) ×2
SUT VIC AB 2-0 CP2 18 (SUTURE) ×4 IMPLANT
SUT VIC AB 3-0 SH 8-18 (SUTURE) ×2 IMPLANT
SYR 20CC LL (SYRINGE) ×2 IMPLANT
SYR 20ML ECCENTRIC (SYRINGE) ×2 IMPLANT
SYR 5ML LL (SYRINGE) IMPLANT
TOWEL OR 17X24 6PK STRL BLUE (TOWEL DISPOSABLE) ×2 IMPLANT
TOWEL OR 17X26 10 PK STRL BLUE (TOWEL DISPOSABLE) ×2 IMPLANT
TRAY FOLEY W/METER SILVER 14FR (SET/KITS/TRAYS/PACK) ×2 IMPLANT
WATER STERILE IRR 1000ML POUR (IV SOLUTION) ×2 IMPLANT

## 2015-06-02 NOTE — Progress Notes (Signed)
Patient states that she has to have her Mirapex at night for her restless leg syndrome.

## 2015-06-02 NOTE — Transfer of Care (Signed)
Immediate Anesthesia Transfer of Care Note  Patient: Kelly Decker  Procedure(s) Performed: Procedure(s) with comments: Lumbar four- five, Lumbar five- Sacral one Posterior lumbar interbody fusion (N/A) - L4-5 L5-S1 Posterior lumbar interbody fusion  Patient Location: PACU  Anesthesia Type:General  Level of Consciousness: awake, sedated and patient cooperative  Airway & Oxygen Therapy: Patient Spontanous Breathing and Patient connected to nasal cannula oxygen  Post-op Assessment: Report given to RN, Post -op Vital signs reviewed and stable and Patient moving all extremities X 4  Post vital signs: Reviewed and stable  Last Vitals:  Filed Vitals:   06/02/15 1051  BP: 127/70  Pulse:   Temp:   Resp:     Complications: No apparent anesthesia complications

## 2015-06-02 NOTE — Anesthesia Preprocedure Evaluation (Addendum)
Anesthesia Evaluation  Patient identified by MRN, date of birth, ID band Patient awake    Reviewed: Allergy & Precautions, NPO status , Patient's Chart, lab work & pertinent test results  History of Anesthesia Complications Negative for: history of anesthetic complications  Airway Mallampati: I  TM Distance: >3 FB Neck ROM: Full    Dental  (+) Teeth Intact, Dental Advisory Given   Pulmonary neg pulmonary ROS,  breath sounds clear to auscultation        Cardiovascular Exercise Tolerance: Good Rhythm:Regular Rate:Normal     Neuro/Psych  Headaches, PSYCHIATRIC DISORDERS Anxiety Depression Bipolar Disorder    GI/Hepatic Neg liver ROS, GERD-  Medicated,  Endo/Other  negative endocrine ROS  Renal/GU negative Renal ROS  negative genitourinary   Musculoskeletal  (+) Arthritis -, Osteoarthritis,    Abdominal   Peds negative pediatric ROS (+)  Hematology negative hematology ROS (+)   Anesthesia Other Findings   Reproductive/Obstetrics negative OB ROS                            Lab Results  Component Value Date   WBC 9.0 05/29/2015   HGB 13.8 05/29/2015   HCT 42.1 05/29/2015   MCV 90.1 05/29/2015   PLT 347 05/29/2015   Lab Results  Component Value Date   CREATININE 0.87 05/29/2015   BUN 15 05/29/2015   NA 140 05/29/2015   K 4.2 05/29/2015   CL 107 05/29/2015   CO2 26 05/29/2015   No results found for: INR, PROTIME   Anesthesia Physical Anesthesia Plan  ASA: II  Anesthesia Plan: General   Post-op Pain Management:    Induction: Intravenous  Airway Management Planned: Oral ETT  Additional Equipment:   Intra-op Plan:   Post-operative Plan: Extubation in OR  Informed Consent: I have reviewed the patients History and Physical, chart, labs and discussed the procedure including the risks, benefits and alternatives for the proposed anesthesia with the patient or authorized  representative who has indicated his/her understanding and acceptance.   Dental advisory given  Plan Discussed with: CRNA  Anesthesia Plan Comments:         Anesthesia Quick Evaluation

## 2015-06-02 NOTE — Progress Notes (Signed)
ANTIBIOTIC CONSULT NOTE - INITIAL  Pharmacy Consult for vancomycin Indication: surgical prophylaxis  Allergies  Allergen Reactions  . Other Other (See Comments)    Novacaine at the dentist "caused my heart rate to drop and made me pass out."  My dentist told me never to use this again.  05/22/15: Lidocaine was used during myelogram without incident.  . Procaine Other (See Comments)    Other reaction(s): Other VERY SLOW HEART RATE  . Tetanus Toxoids Other (See Comments)    Decreased heart rate Passed out  . Ciprofloxacin Hives and Rash    hives  . Gabapentin Hives and Rash  . Penicillins Hives and Rash  . Sulfa Antibiotics Rash and Hives  . Chlorhexidine Rash  . Codeine Nausea Only  . Lac Bovis Diarrhea    ABDOMINAL PAIN    Patient Measurements: Height: 5\' 3"  (160 cm) Weight: 165 lb (74.844 kg) IBW/kg (Calculated) : 52.4  Vital Signs: Temp: 97.7 F (36.5 C) (08/16 2045) Temp Source: Oral (08/16 2045) BP: 98/63 mmHg (08/16 2045) Pulse Rate: 76 (08/16 2045) Intake/Output from previous day:   Intake/Output from this shift:    Labs: No results for input(s): WBC, HGB, PLT, LABCREA, CREATININE in the last 72 hours. Estimated Creatinine Clearance: 69.2 mL/min (by C-G formula based on Cr of 0.87). No results for input(s): VANCOTROUGH, VANCOPEAK, VANCORANDOM, GENTTROUGH, GENTPEAK, GENTRANDOM, TOBRATROUGH, TOBRAPEAK, TOBRARND, AMIKACINPEAK, AMIKACINTROU, AMIKACIN in the last 72 hours.   Microbiology: Recent Results (from the past 720 hour(s))  Surgical pcr screen     Status: None   Collection Time: 05/29/15 11:23 AM  Result Value Ref Range Status   MRSA, PCR NEGATIVE NEGATIVE Final   Staphylococcus aureus NEGATIVE NEGATIVE Final    Comment:        The Xpert SA Assay (FDA approved for NASAL specimens in patients over 30 years of age), is one component of a comprehensive surveillance program.  Test performance has been validated by [facility] for patients  greater than or equal to 12 year old. It is not intended to diagnose infection nor to guide or monitor treatment.     Medical History: Past Medical History  Diagnosis Date  . Dental crown present   . ADD (attention deficit disorder)   . Bipolar disorder   . Anxiety   . Restless leg syndrome   . GERD (gastroesophageal reflux disease)   . Ureteral disorder     as a child  . Recurrent UTI   . History of pneumonia   . Wears glasses   . Depression   . Urinary frequency   . Headache   . Family history of headaches   . Arthritis   . Cancer     right leg - skin cancer    Medications:  Prescriptions prior to admission  Medication Sig Dispense Refill Last Dose  . amphetamine-dextroamphetamine (ADDERALL) 30 MG tablet Take 30 mg by mouth daily.   06/01/2015 at Unknown time  . clonazePAM (KLONOPIN) 1 MG tablet Take 1 mg by mouth daily.   06/01/2015 at Unknown time  . omeprazole (PRILOSEC) 20 MG capsule Take 20 mg by mouth daily.   06/02/2015 at 0900  . OVER THE COUNTER MEDICATION Take 1 tablet by mouth daily. Sensoril     . OVER THE COUNTER MEDICATION Take 1 Dose by mouth daily. ashwagandha   06/01/2015 at Unknown time  . OVER THE COUNTER MEDICATION Take 1 Dose by mouth daily. Saffron extract   06/01/2015 at Unknown time  .  OVER THE COUNTER MEDICATION Take 1 Dose by mouth daily. Calm Aid   06/01/2015 at Unknown time  . pramipexole (MIRAPEX) 1.5 MG tablet Take 1.5 mg by mouth at bedtime.   06/01/2015 at Unknown time  . PRESCRIPTION MEDICATION Apply 1 application topically daily. Compounded hormone cream   06/01/2015 at Unknown time   Assessment: 59 y/o female admitted with lumbar pain s/p L4-5, L5-S1 posterior lumbar interbody fusion with drain placement to begin vancomycin for surgical prophylaxis. Preop vancomycin 1000 mg IV given at 13:40. Renal function is normal.   Goal of Therapy:  Vancomycin trough level 10-15 mcg/ml  Plan:  - Vancomycin 750 mg IV q12h - Follow-up LOT - Monitor  renal function  [facility], Pharm.D., BCPS Clinical Pharmacist Pager: [phone] 06/02/2015 9:22 PM

## 2015-06-02 NOTE — Anesthesia Procedure Notes (Signed)
Procedure Name: Intubation Date/Time: 06/02/2015 2:09 PM Performed by: Rebekah Chesterfield L Pre-anesthesia Checklist: Patient identified, Emergency Drugs available, Suction available, Patient being monitored and Timeout performed Patient Re-evaluated:Patient Re-evaluated prior to inductionOxygen Delivery Method: Circle system utilized Preoxygenation: Pre-oxygenation with 100% oxygen Intubation Type: IV induction Ventilation: Mask ventilation without difficulty Laryngoscope Size: Mac and 3 Grade View: Grade I Tube type: Oral Tube size: 7.0 mm Number of attempts: 1 Airway Equipment and Method: Stylet Placement Confirmation: ETT inserted through vocal cords under direct vision,  positive ETCO2 and breath sounds checked- equal and bilateral Secured at: 22 cm Tube secured with: Tape Dental Injury: Teeth and Oropharynx as per pre-operative assessment

## 2015-06-02 NOTE — Anesthesia Postprocedure Evaluation (Signed)
Anesthesia Post Note  Patient: Kelly Decker  Procedure(s) Performed: Procedure(s) (LRB): Lumbar four- five, Lumbar five- Sacral one Posterior lumbar interbody fusion (N/A)  Anesthesia type: general  Patient location: PACU  Post pain: Pain level controlled  Post assessment: Patient's Cardiovascular Status Stable  Last Vitals:  Filed Vitals:   06/02/15 2045  BP: 98/63  Pulse: 76  Temp: 36.5 C  Resp: 18    Post vital signs: Reviewed and stable  Level of consciousness: sedated  Complications: No apparent anesthesia complications

## 2015-06-03 MED ORDER — DOCUSATE SODIUM 100 MG PO CAPS
100.0000 mg | ORAL_CAPSULE | Freq: Two times a day (BID) | ORAL | Status: DC
  Administered 2015-06-03 – 2015-06-06 (×6): 100 mg via ORAL
  Filled 2015-06-03 (×6): qty 1

## 2015-06-03 MED FILL — Sodium Chloride IV Soln 0.9%: INTRAVENOUS | Qty: 1000 | Status: AC

## 2015-06-03 MED FILL — Heparin Sodium (Porcine) Inj 1000 Unit/ML: INTRAMUSCULAR | Qty: 30 | Status: AC

## 2015-06-03 NOTE — Evaluation (Signed)
Occupational Therapy Evaluation Patient Details Name: Kelly Decker MRN: [id number] DOB: Apr 19, 1956 Today's Date: 06/03/2015    History of Present Illness 59 y/o female admitted with lumbar pain s/p L4-5, L5-S1 posterior lumbar interbody fusion with drain placement. Pt with h/o anxiety, bipolar d/o, anxiwty, and L eye lens replacement   Clinical Impression   Patient is s/p L4-5 L5-S1 PLIF surgery resulting in functional limitations due to the deficits listed below (see OT problem list). PTA independent with all adls and working as Pharmacist, hospital.  Patient will benefit from skilled OT acutely to increase independence and safety with ADLS to allow discharge Gordon. Pt limited by lethargic focused attention and low BP. Pt in chair with spouse at bedside at end of session. Rn notified in chair with Map 60 and SCD on.      Follow Up Recommendations  Home health OT    Equipment Recommendations  None recommended by OT    Recommendations for Other Services       Precautions / Restrictions Precautions Precautions: Back Precaution Comments: handout in room - pt very lethargic and poor recall       Mobility Bed Mobility Overal bed mobility: Needs Assistance Bed Mobility: Rolling;Supine to Sit Rolling: Mod assist   Supine to sit: Mod assist;+2 for physical assistance     General bed mobility comments: bed elevated to help with sit<>Stand  Transfers Overall transfer level: Needs assistance Equipment used: Rolling walker (2 wheeled) Transfers: Sit to/from Stand Sit to Stand: +2 physical assistance;Min assist         General transfer comment: cues for hand placement    Balance Overall balance assessment: Needs assistance Sitting-balance support: Bilateral upper extremity supported;Feet supported Sitting balance-Leahy Scale: Poor                                      ADL Overall ADL's : Needs assistance/impaired Eating/Feeding: Moderate assistance;Bed  level Eating/Feeding Details (indicate cue type and reason): huband holding cup for patient to drink             Upper Body Dressing : Maximal assistance;Sitting Upper Body Dressing Details (indicate cue type and reason): to don brace - educated on positioning and alignment. Pt with poor recall Lower Body Dressing: Total assistance;Sit to/from stand Lower Body Dressing Details (indicate cue type and reason): pt closing eyes throughout session Toilet Transfer: +2 for physical assistance;Minimal assistance;Ambulation;RW             General ADL Comments: pt requesting pain medication. Pt provided pain medication prior to session. Spouse states "honey she just gave you medication" Pt repositioned in chair and reports no pain. Pt states "i am good right here"     Vision Additional Comments: pt closing eyes 95% of session. pt opening eyes on command    Perception     Praxis      Pertinent Vitals/Pain Pain Assessment: No/denies pain Pain Intervention(s): Premedicated before session;Monitored during session;Repositioned     Hand Dominance Right   Extremity/Trunk Assessment Upper Extremity Assessment Upper Extremity Assessment: Generalized weakness   Lower Extremity Assessment Lower Extremity Assessment: Defer to PT evaluation   Cervical / Trunk Assessment Cervical / Trunk Assessment: Other exceptions (surg)   Communication Communication Communication: No difficulties   Cognition Arousal/Alertness: Lethargic Behavior During Therapy: Flat affect Overall Cognitive Status: Impaired/Different from baseline Area of Impairment: Memory;Awareness     Memory: Decreased short-term memory  Awareness: Intellectual   General Comments: 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 Husband helping explain statements. Spouse reports "she has been preparing for school and hanging pictures today"   General Comments       Exercises       Shoulder Instructions       Home Living Family/patient expects to be discharged to:: Private residence Living Arrangements: Spouse/significant other Available Help at Discharge: Family;Available 24 hours/day Type of Home: House Home Access: Stairs to enter [employer] of Steps: 1 Entrance Stairs-Rails: None Home Layout: One level     Bathroom Shower/Tub: Occupational psychologist: Standard Bathroom Accessibility: Yes   Home Equipment: Environmental consultant - 2 wheels   Additional Comments: built in shower chair in walk in shower      Prior Functioning/Environment Level of Independence: Independent        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, museum    OT Diagnosis: Generalized weakness;Cognitive deficits;Acute pain   OT Problem List: Decreased strength;Impaired balance (sitting and/or standing);Decreased activity tolerance;Decreased safety awareness;Decreased knowledge of use of DME or AE;Decreased cognition;Decreased knowledge of precautions;Pain   OT Treatment/Interventions: Self-care/ADL training;Therapeutic exercise;DME and/or AE instruction;Therapeutic activities;Cognitive remediation/compensation;Balance training;Patient/family education    OT Goals(Current goals can be found in the care plan section) Acute Rehab OT Goals Patient Stated Goal: "i think the pain has to stop before I can do anything" OT Goal Formulation: With patient/family Time For Goal Achievement: 06/17/15 Potential to Achieve Goals: Good  OT Frequency: Min 2X/week   Barriers to D/C:            Co-evaluation              End of Session Equipment Utilized During Treatment: Gait belt;Rolling walker;Back brace Nurse Communication: Mobility status;Precautions  Activity Tolerance: Patient tolerated treatment well Patient left: in chair;with call bell/phone within reach;with family/visitor present   Time: 8333-8329 OT Time Calculation (min): 22 min Charges:  OT General Charges $OT Visit: 1 Procedure OT  Evaluation $Initial OT Evaluation Tier I: 1 Procedure G-Codes:    Parke Poisson B 07-01-15, 2:53 PM   Jeri Modena   OTR/L Pager: [phone] Office: [phone] .

## 2015-06-03 NOTE — Evaluation (Signed)
Physical Therapy Evaluation Patient Details Name: Kelly Decker MRN: [id number] DOB: 1956-05-11 Today's Date: 06/03/2015   History of Present Illness  59 y/o female admitted with lumbar pain s/p L4-5, L5-S1 posterior lumbar interbody fusion with drain placement. Pt with h/o anxiety, bipolar d/o, anxiwty, and L eye lens replacement  Clinical Impression  Patient is s/p above surgery resulting in the deficits listed below (see PT Problem List). OOB mobility limited by lethargy and low BP of 74/42. Anticipate once pain and BP under control pt will progress well with mobility. Patient will benefit from skilled PT to increase their independence and safety with mobility (while adhering to their precautions) to allow discharge to the venue listed below.     Follow Up Recommendations Home health PT;Supervision/Assistance - 24 hour    Equipment Recommendations  None recommended by PT    Recommendations for Other Services       Precautions / Restrictions Precautions Precautions: Back Precaution Booklet Issued: Yes (comment) Precaution Comments: pt lethargic with poor carryover, however spouse with good verbal understanding Restrictions Weight Bearing Restrictions: No      Mobility  Bed Mobility Overal bed mobility: Needs Assistance Bed Mobility: Rolling;Sidelying to Sit;Sit to Sidelying Rolling: Min assist Sidelying to sit: Mod assist     Sit to sidelying: Mod assist General bed mobility comments: pt very lethargic and increased pain requiring max directional v/c's for technique, assist for trunk elevation to get to sitting and for LE management back into bed  Transfers                 General transfer comment: limited to sitting EOB due to BP 74/42  Ambulation/Gait             General Gait Details: unable to due to low BP and lethargy  Stairs            Wheelchair Mobility    Modified Rankin (Stroke Patients Only)       Balance Overall balance  assessment: Needs assistance Sitting-balance support: Feet supported;Bilateral upper extremity supported Sitting balance-Leahy Scale: Poor Sitting balance - Comments: requires use of bilat UEs to maintain sitting balance                                     Pertinent Vitals/Pain Pain Assessment: 0-10 Pain Score: 2  Pain Location: surgical site/back, Pain Descriptors / Indicators: Constant Pain Intervention(s): Premedicated before session (pt given percocet, pt now groggy/lethargic)    Home Living Family/patient expects to be discharged to:: Private residence Living Arrangements: Spouse/significant other Available Help at Discharge: Family;Available 24 hours/day Type of Home: House Home Access: Stairs to enter Entrance Stairs-Rails: None Entrance Stairs-Number of Steps: 1 Home Layout: One level Home Equipment: Environmental consultant - 2 wheels Additional Comments: built in shower chair in walk in shower    Prior Function Level of Independence: Independent         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, clinical Dominance   Dominant Hand: Right    Extremity/Trunk Assessment   Upper Extremity Assessment: Defer to OT evaluation           Lower Extremity Assessment: Generalized weakness      Cervical / Trunk Assessment:  (back surgery)  Communication   Communication: No difficulties  Cognition Arousal/Alertness: Lethargic Behavior During Therapy: WFL for tasks assessed/performed (v/c's to maintain eye opening) Overall Cognitive Status: Within Functional Limits for tasks assessed  Memory: Decreased short-term memory (due to lethargy)              General Comments      Exercises        Assessment/Plan    PT Assessment Patient needs continued PT services  PT Diagnosis Acute pain;Abnormality of gait;Generalized weakness   PT Problem List Decreased strength;Decreased range of motion;Decreased activity tolerance;Decreased balance;Decreased  mobility;Decreased coordination  PT Treatment Interventions DME instruction;Gait training;Stair training;Functional mobility training;Therapeutic activities;Therapeutic exercise   PT Goals (Current goals can be found in the Care Plan section) Acute Rehab PT Goals Patient Stated Goal: stop the pain PT Goal Formulation: With patient/family Time For Goal Achievement: 06/10/15 Potential to Achieve Goals: Good    Frequency Min 5X/week   Barriers to discharge        Co-evaluation               End of Session Equipment Utilized During Treatment: Back brace Activity Tolerance: Patient limited by lethargy Patient left: in bed;with call bell/phone within reach;with bed alarm set;with family/visitor present Nurse Communication: Mobility status (low BP)         Time: 7672-0947 PT Time Calculation (min) (ACUTE ONLY): 30 min   Charges:   PT Evaluation $Initial PT Evaluation Tier I: 1 Procedure PT Treatments $Therapeutic Activity: 8-22 mins   PT G CodesKingsley Callander 06/03/2015, 10:12 AM   Kittie Plater, PT, DPT Pager #: [phone] Office #: [phone]

## 2015-06-03 NOTE — Op Note (Signed)
NAMEFRONA, YOST NO.:  [id number]  MEDICAL RECORD NO.:  [id number]  LOCATION:  5C16C                        FACILITY:  [facility]  PHYSICIAN:  Leeroy Cha, M.D.   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 Instability.  Chronic radiculopathy.  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 Instability.  Chronic radiculopathy.  PROCEDURES:  Bilateral L4 laminectomy, facetectomy, laminotomy of L5-S1. Lysis of adhesions.  Bilaterally L4-5 diskectomy.  Interbody fusion with cages.  Pedicle screws at L4, L5.  Posterolateral arthrodesis with autograft, BMP and Vitoss from L4 down to S1.  Cell Saver.  SURGEON:  Leeroy Cha, M.D.  ASSISTANT:  Cooper Render. Pool, M.D.  CLINICAL HISTORY:  The patient was seen in my office complaining of back pain to both legs right worse than the left one.  The patient in the past had been seen by several physicians who advised her surgery.  She is not any better.  She came with her husband in 2 occasions and indeed she wants to proceed with surgery because the pain was keeping her awake at night time and unable to bear.  X-rays showed that she has a fusion already inside with spondylolisthesis grade 2 between L5-S1 and grade 1- 2 between L4-5, which move between flexion and extension.  I sat with both of them.  We talked about surgery.  We talked about the complications, risks, and outcome.  They __________ my opinion.  DESCRIPTION OF PROCEDURE:  The patient was taken to the OR, and after intubation, she was positioned in a prone manner.  The skin was cleaned with DuraPrep and drapes were applied.  Immediately, we found that the patient was quite hyperlordotic.  The protuberance was at the level of S1.   Incision was made in the midline and muscles were retracted all the way laterally.  X-rays were taken and immediately we found that the click was right at the level of L4-5.  From then on, we investigated the area between L5-1.  Indeed, the area was quite solid and there was not any movement whatsoever.  Most of the movement was at the level of L4-5. From then on, we proceeded with removal of the spinous process of L4. With x-ray we identified the disk space.  The patient has quite a bit of adhesions.  The facet was quite loose right worse than the left one. Facetectomy of L4 was made.  After we lysed the adhesions of the thecal sac as well as the L4 and L5 nerve roots, we investigated the L5-1 which was quite opened up with the myelogram.  We entered the disk space at the level L4-5 first in the right side and then in the left side.  Total gross diskectomy was done and the endplates were removed.  Then, 2 cages, expandable with BMP and autograft inside were inserted all the way up to 17 mm.  The lordosis was 51.5.  The rest of the disk space was filled with BMP and autograft.  Then,  using the C-arm first in AP view and then in lateral view, we made holes in the pedicle of L4, L5, 4 x 4 pedicles.  Prior to insertion, we were sure that we were surrounded by bone in lower quadrant.  The screws were 5.5 x 40 x 45 mm.  They were connected with a rod 2 of them, as well as caps.  Because the area was quite narrow, we were unable to use a cross-link.  From then on, we went laterally and we removed the periosteum of transverse process and lateral aspect of the facet of 4-5 and 5-1 and a mix of BMP and autograft as well as Vitoss were used for arthrodesis.  The area was irrigated.  Prior to closure, we went back into the canal and we found plenty of space for the thecal sac as well as the L4, L5, and S1 nerve roots.  Valsalva maneuver up to 40 was negative twice.  Vancomycin was left in the  operative site and the wound was closed with Vicryl and Steri-Strips.  A large drain was left at the area of surgery.          ______________________________ Leeroy Cha, M.D.     EB/MEDQ  D:  06/02/2015  T:  06/03/2015  Job:  [id number]

## 2015-06-03 NOTE — Progress Notes (Signed)
Patient ID: Kelly Decker, female   DOB: [birth year], 59 y.o.   MRN: [id number] Sleepy, c/o incisional pain. leg pain better.no weakness.

## 2015-06-03 NOTE — Care Management Note (Signed)
Case Management Note  Patient Details  Name: Kelly Decker MRN: [id number] Date of Birth: [birth year]  Subjective/Objective:                    Action/Plan: Patient was admitted for a PLIF. Lives at home with spouse.  Will follow for discharge needs pending PT/OT evals and physician orders.  Expected Discharge Date:                  Expected Discharge Plan:  [place]  In-House Referral:     Discharge planning Services  CM Consult  Post Acute Care Choice:    Choice offered to:     DME Arranged:    DME Agency:     HH Arranged:    [place] Agency:     Status of Service:  In process, will continue to follow  Medicare Important Message Given:    Date Medicare IM Given:    Medicare IM give by:    Date Additional Medicare IM Given:    Additional Medicare Important Message give by:     If discussed at [place] of Stay Meetings, dates discussed:    Additional Comments:  Rolm Baptise, RN 06/03/2015, 2:17 PM

## 2015-06-03 NOTE — Progress Notes (Signed)
Pt refused straight cath insisted that she would urinate if staff gave her a coke and put her on the bedside commode. Pt bladder scanned 273ml after 6 hours of not voiding. Pt was able to void after being assisted staff x2 to the bedside commode. Husband at bedside. Will monitor. She denied pain or discomfort.

## 2015-06-03 NOTE — Progress Notes (Signed)
Pt transferred to unit from PACU. Pt alert and oriented upon arrival. No signs or symptoms of acute distress. Pt oriented to unit as well as unit procedures. Pt resting in bed at lowest position, bed alarm on, call light in reach. Will continue to monitor. Fortino Sic, RN, BSN 06/03/2015 12:44 AM

## 2015-06-03 NOTE — Plan of Care (Signed)
Problem: Consults Goal: Diagnosis - Spinal Surgery Outcome: Completed/Met Date Met:  06/03/15 Thoraco/Lumbar Spine Fusion     

## 2015-06-04 MED ORDER — TRAMADOL HCL 50 MG PO TABS
50.0000 mg | ORAL_TABLET | Freq: Four times a day (QID) | ORAL | Status: DC | PRN
  Administered 2015-06-04: 50 mg via ORAL
  Filled 2015-06-04: qty 1

## 2015-06-04 MED ORDER — BISACODYL 10 MG RE SUPP: 10.0000 mg | Freq: Every day | RECTAL | Status: DC | PRN

## 2015-06-04 MED ORDER — OXYCODONE HCL 5 MG PO TABS: 15.0000 mg | ORAL_TABLET | ORAL | Status: DC | PRN

## 2015-06-04 NOTE — Progress Notes (Signed)
Paged MD on call, pt requesting Tramadol instead of the ordered Oxy IR.  Awaiting orders.

## 2015-06-04 NOTE — Clinical Social Work Note (Signed)
CSW consult acknowledged:  Clinical Social Worker received consult in reference to post-acute placement for SNF. PT currently recommending Home Health.   Clinical Social Worker will sign off for now as social work intervention is no longer needed. Please consult [place] again if new need arises.  Glendon Axe, MSW, LCSWA [phone] 06/04/2015 3:06 PM

## 2015-06-04 NOTE — Progress Notes (Signed)
Patient ID: Kelly Decker, female   DOB: 09-Apr-1956, 59 y.o.   MRN: [id number] Stable, right leg pain better. No weakness

## 2015-06-04 NOTE — Progress Notes (Signed)
Occupational Therapy Treatment Patient Details Name: Kelly Decker MRN: [id number] DOB: [birth year] Today's Date: 06/04/2015    History of present illness 59 y/o female admitted with lumbar pain s/p L4-5, L5-S1 posterior lumbar interbody fusion with drain placement. Pt with h/o anxiety, bipolar d/o, anxiwty, and L eye lens replacement   OT comments  Pt reports L LE feeling weak and "it is going to give". Pt complete education on transfers this session and LB dressing L LE first by cross LE in seat position. Pt progressing toward goals however unable to recall all precautions. Pt recalling "bending" only.   Follow Up Recommendations  Home health OT    Equipment Recommendations  None recommended by OT    Recommendations for Other Services      Precautions / Restrictions Precautions Precautions: Back Precaution Comments: handout in room and verbally reviewed with patient       Mobility Bed Mobility               General bed mobility comments: in bathroom on arrival  Transfers Overall transfer level: Needs assistance Equipment used: Rolling walker (2 wheeled) Transfers: Sit to/from Stand Sit to Stand: Min guard              Balance Overall balance assessment: Needs assistance         Standing balance support: Single extremity supported;During functional activity Standing balance-Leahy Scale: Fair                     ADL Overall ADL's : Needs assistance/impaired Eating/Feeding: Modified independent   Grooming: Wash/dry hands;Supervision/safety;Sitting               Lower Body Dressing: Supervision/safety;Sit to/from stand Lower Body Dressing Details (indicate cue type and reason): able to cross Toilet Transfer: Physicist, medical;Ambulation;Grab bars;RW;Regular Glass blower/designer Details (indicate cue type and reason): cues for back precautions due to low surface height     Tub/ Shower Transfer: Nurse, learning disability  Details (indicate cue type and reason): educated on shower setup . pt has spouse putting in hand held shower head and borrowing 3n1 from aunt Functional mobility during ADLs: Supervision/safety General ADL Comments: pt in bathroom on arrival and complete bathroom transfers. pt able to sit in chair and cross bil LE for LB dressing. pt requesting personal PJ set however currently with wound vac. Pt advised to only wear bottoms at this time. 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 wears glasses all the time. pt scanning environment without deficits noted this session   Perception     Praxis      Cognition   Behavior During Therapy: WFL for tasks assessed/performed Overall Cognitive Status: Within Functional Limits for tasks assessed                       Extremity/Trunk Assessment               Exercises     Shoulder Instructions       General Comments      Pertinent Vitals/ Pain       Pain Assessment: No/denies pain Pain Intervention(s): Premedicated before session  Home Living Family/patient expects to be discharged to:: Private residence Living Arrangements: Spouse/significant other  Prior Functioning/Environment              Frequency Min 2X/week     Progress Toward Goals  OT Goals(current goals can now be found in the care plan section)  Progress towards OT goals: Progressing toward goals  Acute Rehab OT Goals Patient Stated Goal: to eat something OT Goal Formulation: With patient/family Time For Goal Achievement: 06/17/15 Potential to Achieve Goals: Good ADL Goals Pt Will Perform Grooming: with supervision;standing Pt Will Perform Upper Body Bathing: with supervision;standing Pt Will Perform Lower Body Bathing: with supervision;sit to/from stand Pt Will Transfer to Toilet: with supervision;ambulating;regular  height toilet Pt Will Perform Tub/Shower Transfer: Shower transfer;ambulating;rolling walker;with supervision Additional ADL Goal #1: Pt will don doff brace supervision level as precursor to basic transfers  Plan Discharge plan remains appropriate    Co-evaluation                 End of Session Equipment Utilized During Treatment: Gait belt;Rolling walker;Back brace   Activity Tolerance Patient tolerated treatment well   Patient Left in chair;with call bell/phone within reach;with family/visitor present   Nurse Communication Mobility status;Precautions        Time: 6659-9357 OT Time Calculation (min): 16 min  Charges: OT General Charges $OT Visit: 1 Procedure OT Treatments $Self Care/Home Management : 8-22 mins  Parke Poisson B 06/04/2015, 10:38 AM    Jeri Modena   OTR/L Pager: [phone] Office: [phone] .

## 2015-06-04 NOTE — Progress Notes (Signed)
Physical Therapy Treatment Patient Details Name: Kelly Decker MRN: [id number] DOB: 22-Jul-1956 Today's Date: 06/04/2015    History of Present Illness 59 y/o female admitted with lumbar pain s/p L4-5, L5-S1 posterior lumbar interbody fusion with drain placement. Pt with h/o anxiety, bipolar d/o, anxiwty, and L eye lens replacement    PT Comments    Pt is Progressing towards physical therapy goals. Pt was able to ambulate today with min guard A using a RW. Pt states that she feel like her L knee is unstable however there was no LOB, or noticeable buckling during bout. Pt husband was present during therapy session. Pt able to recall 3/3 back precautions. Will follow acutely. DC destination remains appropriate for pt to increased functional mobility an independence.  Follow Up Recommendations  Home health PT;Supervision/Assistance - 24 hour     Equipment Recommendations  None recommended by PT    Recommendations for Other Services       Precautions / Restrictions Precautions Precautions: Back Precaution Comments: reviewed back precaution with pt and husband Restrictions Weight Bearing Restrictions: No    Mobility  Bed Mobility               General bed mobility comments: in chair at start of session  Transfers Overall transfer level: Needs assistance Equipment used: Rolling walker (2 wheeled) Transfers: Sit to/from Stand Sit to Stand: Min assist         General transfer comment: increased time, cues for hand placement on chair, min A for safety  Ambulation/Gait Ambulation/Gait assistance: Min guard;Min assist Ambulation Distance (Feet): 100 Feet Assistive device: Rolling walker (2 wheeled) Gait Pattern/deviations: Step-through pattern;Decreased stride length;Decreased stance time - left;Trunk flexed;Narrow base of support Gait velocity: slow Gait velocity interpretation: Below normal speed for age/gender General Gait Details: pt stated that L knee felt unstable  however no LOB  or unsteadiness during ambulation bout. cues to maintain up right posture and to stay in close proximity to walker while ambulating. edcuated pt of proper turning technique with walker to adhere to back precautions   Stairs            Wheelchair Mobility    Modified Rankin (Stroke Patients Only)       Balance Overall balance assessment: Needs assistance Sitting-balance support: No upper extremity supported;Feet supported Sitting balance-Leahy Scale: Good     Standing balance support: Bilateral upper extremity supported;During functional activity Standing balance-Leahy Scale: Poor Standing balance comment: needs AD to maintain balance                    Cognition Arousal/Alertness: Awake/alert Behavior During Therapy: WFL for tasks assessed/performed Overall Cognitive Status: Within Functional Limits for tasks assessed                      Exercises      General Comments General comments (skin integrity, edema, etc.): husband was present during therapy session,       Pertinent Vitals/Pain Pain Assessment: 0-10 Pain Score: 3  Pain Location: surgical back site Pain Descriptors / Indicators: Aching;Sore Pain Intervention(s): Monitored during session;Premedicated before session    Home Living                      Prior Function            PT Goals (current goals can now be found in the care plan section) Acute Rehab PT Goals Patient Stated Goal: none stated during session Progress towards  PT goals: Progressing toward goals    Frequency  Min 5X/week    PT Plan Current plan remains appropriate    Co-evaluation             End of Session Equipment Utilized During Treatment: Back brace Activity Tolerance: Patient tolerated treatment well Patient left: in chair;with call bell/phone within reach;with family/visitor present     Time: 1040-1056 PT Time Calculation (min) (ACUTE ONLY): 16 min  Charges:  $Gait  Training: 8-22 mins                    G Codes:      Renaee Munda Jun 08, 2015, 12:44 PM   Lequita Halt (student physical therapy assistant) Acute Rehab [phone]

## 2015-06-05 NOTE — Progress Notes (Signed)
ANTIBIOTIC CONSULT NOTE - follow up  Pharmacy Consult for vancomycin Indication: surgical prophylaxis while drain in place  Allergies  Allergen Reactions  . Other Other (See Comments)    Novacaine at the dentist "caused my heart rate to drop and made me pass out."  My dentist told me never to use this again.  05/22/15: Lidocaine was used during myelogram without incident.  . Procaine Other (See Comments)    Other reaction(s): Other VERY SLOW HEART RATE  . Tetanus Toxoids Other (See Comments)    Decreased heart rate Passed out  . Ciprofloxacin Hives and Rash    hives  . Gabapentin Hives and Rash  . Penicillins Hives and Rash  . Sulfa Antibiotics Rash and Hives  . Chlorhexidine Rash  . Codeine Nausea Only  . Lac Bovis Diarrhea    ABDOMINAL PAIN    Patient Measurements: Height: 5\' 3"  (160 cm) Weight: 165 lb (74.844 kg) IBW/kg (Calculated) : 52.4  Vital Signs: Temp: 99 F (37.2 C) (08/19 0945) Temp Source: Oral (08/19 0945) BP: 104/49 mmHg (08/19 0945) Pulse Rate: 79 (08/19 0945) Intake/Output from previous day: 08/18 0701 - 08/19 0700 In: 300 [P.O.:300] Out: 60 [Drains:60] Intake/Output from this shift: Total I/O In: 240 [P.O.:240] Out: -   Labs: No results for input(s): WBC, HGB, PLT, LABCREA, CREATININE in the last 72 hours. Estimated Creatinine Clearance: 69.2 mL/min (by C-G formula based on Cr of 0.87). No results for input(s): VANCOTROUGH, VANCOPEAK, VANCORANDOM, GENTTROUGH, GENTPEAK, GENTRANDOM, TOBRATROUGH, TOBRAPEAK, TOBRARND, AMIKACINPEAK, AMIKACINTROU, AMIKACIN in the last 72 hours.   Microbiology: Recent Results (from the past 720 hour(s))  Surgical pcr screen     Status: None   Collection Time: 05/29/15 11:23 AM  Result Value Ref Range Status   MRSA, PCR NEGATIVE NEGATIVE Final   Staphylococcus aureus NEGATIVE NEGATIVE Final    Comment:        The Xpert SA Assay (FDA approved for NASAL specimens in patients over 31 years of age), is one component  of a comprehensive surveillance program.  Test performance has been validated by [facility] for patients greater than or equal to 45 year old. It is not intended to diagnose infection nor to guide or monitor treatment.      Assessment: 59 y/o female admitted with lumbar pain s/p L4-5, L5-S1 posterior lumbar interbody fusion with drain placement on vancomycin for surgical prophylaxis. Vanc day # 4.  AF. Lumbar drain still in - 60 ml output recorded. Anticipate DC soon, so no levels planned.  Goal of Therapy:  Vancomycin trough level 10-15 mcg/ml  Plan:  - Vancomycin 750 mg IV q12h - Follow-up LOT - Monitor renal function   Eudelia Bunch, Pharm.D. [phone] 06/05/2015 11:33 AM

## 2015-06-05 NOTE — Progress Notes (Signed)
Occupational Therapy Treatment Patient Details Name: Kelly Decker MRN: [id number] DOB: 1956/08/17 Today's Date: 06/05/2015    History of present illness 59 y/o female admitted with lumbar pain s/p L4-5, L5-S1 posterior lumbar interbody fusion with drain placement. Pt with h/o anxiety, bipolar d/o, anxiwty, and L eye lens replacement   OT comments  Pt required x2 attempts to complete OT treatment this session due to arousal. Spouse reporting patient is not at baseline and concern over sleepy state. Pt agreeable to Adderall medication in a small dose from RN to attempt to incr arousal.     Follow Up Recommendations  Home health OT    Equipment Recommendations  None recommended by OT    Recommendations for Other Services      Precautions / Restrictions Precautions Precautions: Back Precaution Booklet Issued: Yes (comment) Precaution Comments: poor return demo of back precautions with peri care Required Braces or Orthoses: Spinal Brace Spinal Brace: Lumbar corset Restrictions Weight Bearing Restrictions: No       Mobility Bed Mobility               General bed mobility comments: sitting EOB unsupported with no brace  Transfers Overall transfer level: Needs assistance Equipment used: Rolling walker (2 wheeled) Transfers: Sit to/from Stand Sit to Stand: Mod assist         General transfer comment: cues for hand placement    Balance Overall balance assessment: Needs assistance Sitting-balance support: Bilateral upper extremity supported;Feet supported Sitting balance-Leahy Scale: Fair     Standing balance support: Bilateral upper extremity supported;During functional activity Standing balance-Leahy Scale: Poor Standing balance comment: Requires Min A-Max A for static and dynamic standing balance due to truncal sway forward and knee instability.                    ADL Overall ADL's : Needs assistance/impaired     Grooming: Wash/dry face;Moderate  assistance;Standing Grooming Details (indicate cue type and reason): cues for safety cues for hand placement physical (A) to maintain static standing balance safely                 Toilet Transfer: Moderate assistance;Ambulation;RW;Regular Toilet;Grab bars Toilet Transfer Details (indicate cue type and reason): cues for back precautions and safety Toileting- Clothing Manipulation and Hygiene: Min guard;Sit to/from stand Toileting - Clothing Manipulation Details (indicate cue type and reason): cues for bending     Functional mobility during ADLs: Moderate assistance;Rolling walker General ADL Comments: pt very restless on EOB without brace on arrival. pt rocking forward and backward and shifting weight lateral on arms. Pt awake at this time. OT attempting to speak with patient at noon and pt aroused to name. At noon however pt was unable to sustain arousal. Pt demonstrates very impulsive movements with therapy and L LE weakness      Vision                     Perception     Praxis      Cognition   Behavior During Therapy: Restless Overall Cognitive Status: Within Functional Limits for tasks assessed                  General Comments: spouse states "this is not her. she is not like this". Pt states "i havent slept in days"  RN and spouse report patient sleeping majority of the day 06/05/15. Pt less aroused and alert compared to previous session.     Extremity/Trunk Assessment  Exercises     Shoulder Instructions       General Comments      Pertinent Vitals/ Pain       Pain Assessment: Faces Faces Pain Scale: Hurts little more Pain Location: surgicial site Pain Descriptors / Indicators: Discomfort Pain Intervention(s): Monitored during session;Repositioned  Home Living                                          Prior Functioning/Environment              Frequency Min 2X/week     Progress Toward Goals  OT  Goals(current goals can now be found in the care plan section)  Progress towards OT goals: Not progressing toward goals - comment (arousal levels/ recall of information)  Acute Rehab OT Goals Patient Stated Goal: none stated during session OT Goal Formulation: With patient/family Time For Goal Achievement: 06/17/15 Potential to Achieve Goals: Good ADL Goals Pt Will Perform Grooming: with supervision;standing Pt Will Perform Upper Body Bathing: with supervision;standing Pt Will Perform Lower Body Bathing: with supervision;sit to/from stand Pt Will Transfer to Toilet: with supervision;ambulating;regular height toilet Pt Will Perform Tub/Shower Transfer: Shower transfer;ambulating;rolling walker;with supervision Additional ADL Goal #1: Pt will don doff brace supervision level as precursor to basic transfers  Plan Discharge plan remains appropriate    Co-evaluation                 End of Session Equipment Utilized During Treatment: Gait belt;Rolling walker;Back brace   Activity Tolerance Patient tolerated treatment well   Patient Left in chair;with call bell/phone within reach;with chair alarm set;with family/visitor present   Nurse Communication Mobility status;Precautions        Time: 0347-4259 OT Time Calculation (min): 23 min  Charges: OT General Charges $OT Visit: 1 Procedure OT Treatments $Self Care/Home Management : 23-37 mins  Parke Poisson B 06/05/2015, 1:54 PM   Jeri Modena   OTR/L Pager: [phone] Office: [phone] .

## 2015-06-05 NOTE — Progress Notes (Signed)
Physical Therapy Treatment Patient Details Name: Kelly Decker MRN: [id number] DOB: 1956/08/20 Today's Date: 06/05/2015    History of Present Illness 59 y/o female admitted with lumbar pain s/p L4-5, L5-S1 posterior lumbar interbody fusion with drain placement. Pt with h/o anxiety, bipolar d/o, anxiwty, and L eye lens replacement    PT Comments    Patient lethargic with difficulty staying awake during session. Pt requiring increased assist for mobility today-Mod-Max A and knee instability noted bilaterally Lft>Rt. Poor safety awareness during gait training with therapist requiring forced seated rest breaks due to almost falls during ambulation. MD notified of change from yesterday's session. Will follow acutely.   Follow Up Recommendations  Home health PT;Supervision/Assistance - 24 hour (pending improvement/progress)     Equipment Recommendations  None recommended by PT    Recommendations for Other Services       Precautions / Restrictions Precautions Precautions: Back Precaution Booklet Issued: Yes (comment) Precaution Comments: Pt able to verbalize 2/3 back precautions independently.  Required Braces or Orthoses: Spinal Brace Spinal Brace: Lumbar corset Restrictions Weight Bearing Restrictions: No    Mobility  Bed Mobility               General bed mobility comments: Sitting in recliner upon PT arrival.   Transfers Overall transfer level: Needs assistance Equipment used: Rolling walker (2 wheeled) Transfers: Sit to/from Stand Sit to Stand: Mod assist;Max assist         General transfer comment: Mod A to boost from chair x1 on first attempt; Max A to boost up from chair on second attempt. Cues for hand placement. Pt thrashing body and LEs around and difficulty placing them appropriately. Anterior lean upon standing with increased knee flexion.  Ambulation/Gait Ambulation/Gait assistance: Mod assist;Max assist;+2 safety/equipment Ambulation Distance (Feet): 15  Feet (+20') Assistive device: Rolling walker (2 wheeled)   Gait velocity: slow   General Gait Details: Pt very unsteady with ataxic like gait pattern with sway noted in trunk and difficulty placing LEs in step through gait pattern. Mod A for RW management/proximity. Knee instability bilaterally L>R. increased hip/knee flexion as distance increased with therapist forcing pt to sit due to fall risk.   Stairs            Wheelchair Mobility    Modified Rankin (Stroke Patients Only)       Balance Overall balance assessment: Needs assistance Sitting-balance support: Feet supported;No upper extremity supported Sitting balance-Leahy Scale: Fair     Standing balance support: During functional activity Standing balance-Leahy Scale: Poor Standing balance comment: Requires Min A-Max A for static and dynamic standing balance due to truncal sway forward and knee instability.                     Cognition Arousal/Alertness: Lethargic Behavior During Therapy: Restless Overall Cognitive Status: Within Functional Limits for tasks assessed                 General Comments: "I need to sleep; i haven't slept in 3 days." Despite spouse telling therapist that pt as been non stop sleeping for 3 days.    Exercises      General Comments General comments (skin integrity, edema, etc.): Left knee extension ~3/5, hip flexion 3/5.      Pertinent Vitals/Pain Pain Assessment: Faces Faces Pain Scale: Hurts a little bit Pain Location: back at surgical site Pain Descriptors / Indicators: Sore;Aching Pain Intervention(s): Monitored during session;Repositioned    Home Living  Prior Function            PT Goals (current goals can now be found in the care plan section) Progress towards PT goals: Not progressing toward goals - comment    Frequency  Min 5X/week    PT Plan Current plan remains appropriate    Co-evaluation             End of  Session Equipment Utilized During Treatment: Back brace;Gait belt Activity Tolerance: Patient limited by lethargy;Patient limited by fatigue Patient left: in chair;with call bell/phone within reach;with family/visitor present     Time: 7116-5790 PT Time Calculation (min) (ACUTE ONLY): 24 min  Charges:  $Gait Training: 8-22 mins $Therapeutic Activity: 8-22 mins                    G Codes:      [name] 06/05/2015, 1:28 PM Wray Kearns, [place], DPT [phone]

## 2015-06-06 MED ORDER — TRAMADOL HCL 50 MG PO TABS: 50.0000 mg | ORAL_TABLET | Freq: Four times a day (QID) | ORAL | Status: AC | PRN

## 2015-06-06 NOTE — Discharge Summary (Signed)
  Physician Discharge Summary  Patient ID: Kelly Decker MRN: [id number] DOB/AGE: 25-Mar-1956 59 y.o.  Admit date: 06/02/2015 Discharge date: 06/06/2015  Admission Diagnoses:  Discharge Diagnoses:  Active Problems:   Spondylolisthesis of lumbar region   Discharged Condition: good  Hospital Course: Surgery 3 days ago for 2 level plif. Did well with slow but steady progress. Wound healing well. By today, ambulated well. Wound clean and dry. Home with specific instructions given.  Consults: None  Significant Diagnostic Studies: nonme  Treatments: surgery: L 45 L5S1 plif  Discharge Exam: Blood pressure 118/62, pulse 108, temperature 99.4 F (37.4 C), temperature source Oral, resp. rate 20, height 5\' 3"  (1.6 m), weight 74.844 kg (165 lb), SpO2 84 %. Incision/Wound:clean and dry; no new neuro issues  Disposition: Final discharge disposition not confirmed     Medication List    ASK your doctor about these medications        amphetamine-dextroamphetamine 30 MG tablet  Commonly known as:  ADDERALL  Take 30 mg by mouth daily.     clonazePAM 1 MG tablet  Commonly known as:  KLONOPIN  Take 1 mg by mouth daily.     omeprazole 20 MG capsule  Commonly known as:  PRILOSEC  Take 20 mg by mouth daily.     OVER THE COUNTER MEDICATION  Take 1 tablet by mouth daily. Sensoril     OVER THE COUNTER MEDICATION  Take 1 Dose by mouth daily. ashwagandha     OVER THE COUNTER MEDICATION  Take 1 Dose by mouth daily. Saffron extract     OVER THE COUNTER MEDICATION  Take 1 Dose by mouth daily. Calm Aid     pramipexole 1.5 MG tablet  Commonly known as:  MIRAPEX  Take 1.5 mg by mouth at bedtime.     PRESCRIPTION MEDICATION  Apply 1 application topically daily. Compounded hormone cream         At home rest most of the time. Get up 9 or 10 times each day and take a 15 or 20 minute walk. No riding in the car and to your first postoperative appointment. If you have neck surgery you may  shower from the chest down starting on the third postoperative day. If you had back surgery he may start showering on the third postoperative day with saran wrap wrapped around your incisional area 3 times. After the shower remove the saran wrap. Take pain medicine as needed and other medications as instructed. Call my office for an appointment.  SignedFaythe Ghee, MD 06/06/2015, 10:20 AM

## 2015-06-06 NOTE — Care Management Note (Signed)
Case Management Note  Patient Details  Name: Kelly Decker MRN: [id number] Date of Birth: [birth year]  Subjective/Objective:                   Spondylolisthesis of lumbar region   Action/Plan: Discharge planning  Expected Discharge Date:   (Pending)               Expected Discharge Plan:  [place]  In-House Referral:     Discharge planning Services  CM Consult  Post Acute Care Choice:    Choice offered to:  Patient  DME Arranged:    DME Agency:     HH Arranged:  PT, OT HH Agency:  [facility]  Status of Service:  Completed, signed off  Medicare Important Message Given:    Date Medicare IM Given:    Medicare IM give by:    Date Additional Medicare IM Given:    Additional Medicare Important Message give by:     If discussed at [place] of Stay Meetings, dates discussed:    Additional Comments: CM met with pt in room to offer choice of home health agency.  Pt chooses [facility] to render HHPT/OT.  Address and contact information verified by pt.  Referral called to [facility], [place].  No DME needed as she has both a 3n1 and rolling walker at home.  NO other CM needs were communicated. Dellie Catholic, RN 06/06/2015, 12:40 PM

## 2015-06-06 NOTE — Progress Notes (Signed)
Occupational Therapy Treatment Patient Details Name: Kelly Decker MRN: [id number] DOB: November 14, 1955 Today's Date: 06/06/2015    History of present illness 59 y/o female admitted with lumbar pain s/p L4-5, L5-S1 posterior lumbar interbody fusion with drain placement. Pt with h/o anxiety, bipolar d/o, anxiwty, and L eye lens replacement   OT comments  Pt. With notable improvements this session with level of alertness and pain management.  Able to complete toileting tasks and shower stall transfer safely.  Husband present and very active in session.  Pt. Eager for d/c home today if able.    Follow Up Recommendations  Home health OT    Equipment Recommendations  None recommended by OT    Recommendations for Other Services      Precautions / Restrictions Precautions Precautions: Back Precaution Comments: demonstrated safe incorporation of back precautions into functional tasks Required Braces or Orthoses: Spinal Brace Spinal Brace: Lumbar corset       Mobility Bed Mobility               General bed mobility comments: in recliner upon arrival  Transfers Overall transfer level: Needs assistance Equipment used: Rolling walker (2 wheeled) Transfers: Sit to/from [employer] Transfers Sit to Stand: Supervision Stand pivot transfers: Supervision            Balance                                   ADL Overall ADL's : Needs assistance/impaired     Grooming: Wash/dry hands;Standing;Supervision/safety         Lower Body Bathing Details (indicate cue type and reason): able to cross legs over, and husband also available to assist as needed Upper Body Dressing : Maximal assistance;Sitting Upper Body Dressing Details (indicate cue type and reason): husband assists with donning brace    Lower Body Dressing Details (indicate cue type and reason): able to cross legs over, and husband also available to assist as needed Toilet Transfer:  Supervision/safety;RW;Regular Toilet;Grab bars   Toileting- Clothing Manipulation and Hygiene: Supervision/safety;Sitting/lateral lean   Tub/ Shower Transfer: Walk-in shower;Min guard;Ambulation;Anterior/posterior;Shower seat   Functional mobility during ADLs: Supervision/safety General ADL Comments: pt. with noted improvement from previous session.  alert and moving well. wants d/c home later today.  has husband assist. able to perform toilet and shower stall transfer safely. husband present and also educated on tech.      Vision                     Perception     Praxis      Cognition   Behavior During Therapy: [facility] for tasks assessed/performed                         Extremity/Trunk Assessment               Exercises     Shoulder Instructions       General Comments      Pertinent Vitals/ Pain       Pain Assessment: No/denies pain  Home Living                                          Prior Functioning/Environment              Frequency Min  2X/week     Progress Toward Goals  OT Goals(current goals can now be found in the care plan section)  Progress towards OT goals: Progressing toward goals     Plan Discharge plan remains appropriate    Co-evaluation                 End of Session Equipment Utilized During Treatment: Gait belt;Rolling walker   Activity Tolerance Patient tolerated treatment well   Patient Left in chair;with call bell/phone within reach;with family/visitor present   Nurse Communication          Time: 9892-1194 OT Time Calculation (min): 17 min  Charges: OT General Charges $OT Visit: 1 Procedure OT Treatments $Self Care/Home Management : 8-22 mins  Janice Coffin, COTA/L 06/06/2015, 10:07 AM

## 2015-06-06 NOTE — Progress Notes (Signed)
Physical Therapy Treatment Patient Details Name: Kelly Decker MRN: [id number] DOB: 1956/09/21 Today's Date: 06/06/2015    History of Present Illness 59 y/o female admitted with lumbar pain s/p L4-5, L5-S1 posterior lumbar interbody fusion with drain placement. Pt with h/o anxiety, bipolar d/o, anxiwty, and L eye lens replacement    PT Comments    Pt with significant improvement in mobility and cognition today. Plan is for d/c home today. Stair training complete.  Follow Up Recommendations  Home health PT;Supervision/Assistance - 24 hour     Equipment Recommendations  None recommended by PT    Recommendations for Other Services       Precautions / Restrictions Precautions Precautions: Back Precaution Comments: Pt independently recalled 3/3 precautions. Required Braces or Orthoses: Spinal Brace Spinal Brace: Lumbar corset;Applied in sitting position    Mobility  Bed Mobility               General bed mobility comments: Pt in recliner upon arrival.  Transfers Overall transfer level: Needs assistance Equipment used: Rolling walker (2 wheeled) Transfers: Sit to/from [employer] Sit to Stand: Supervision Stand pivot transfers: Supervision       General transfer comment: cues for hand placement  Ambulation/Gait Ambulation/Gait assistance: Min guard Ambulation Distance (Feet): 150 Feet Assistive device: Rolling walker (2 wheeled) Gait Pattern/deviations: Step-through pattern;Antalgic Gait velocity: slow Gait velocity interpretation: Below normal speed for age/gender General Gait Details: decreased heel strike LLE requiring verbal cues   Stairs Stairs: Yes Stairs assistance: Min guard Stair Management: Forwards;With walker Number of Stairs: 1    Wheelchair Mobility    Modified Rankin (Stroke Patients Only)       Balance                                    Cognition Arousal/Alertness: Awake/alert Behavior During  Therapy: WFL for tasks assessed/performed Overall Cognitive Status: Within Functional Limits for tasks assessed                      Exercises      General Comments        Pertinent Vitals/Pain Pain Assessment: 0-10 Pain Score: 3  Pain Location: L hip Pain Descriptors / Indicators: Discomfort Pain Intervention(s): Monitored during session;Repositioned    Home Living                      Prior Function            PT Goals (current goals can now be found in the care plan section) Acute Rehab PT Goals Patient Stated Goal: home today PT Goal Formulation: With patient/family Time For Goal Achievement: 06/10/15 Potential to Achieve Goals: Good Progress towards PT goals: Progressing toward goals    Frequency  Min 5X/week    PT Plan Current plan remains appropriate    Co-evaluation             End of Session Equipment Utilized During Treatment: Back brace;Gait belt Activity Tolerance: Patient tolerated treatment well Patient left: in chair;with family/visitor present;with call bell/phone within reach     Time: 1006-1037 PT Time Calculation (min) (ACUTE ONLY): 31 min  Charges:  $Gait Training: 23-37 mins                    G Codes:      Lorriane Shire 06/06/2015, 10:42 AM

## 2015-06-06 NOTE — Progress Notes (Signed)
DC instructions and prescription for ultram given to patient and husband. Encouraged to call Monday for F/U appt. All questions answered. Patient in [facility] escorted to lobby. Patient to be transported home in private vehicle by husband.

## 2016-02-03 ENCOUNTER — Emergency Department (HOSPITAL_COMMUNITY)
Admission: EM | Admit: 2016-02-03 | Discharge: 2016-02-03 | Disposition: A | Discharge status: Home / Self Care | Payer: BC Managed Care – PPO | Attending: Emergency Medicine | Admitting: Emergency Medicine

## 2016-02-03 ENCOUNTER — Emergency Department (HOSPITAL_COMMUNITY): Payer: BC Managed Care – PPO

## 2016-02-03 ENCOUNTER — Encounter (HOSPITAL_COMMUNITY): Payer: Self-pay | Admitting: Emergency Medicine

## 2016-02-03 DIAGNOSIS — G8929 Other chronic pain: Secondary | ICD-10-CM | POA: Diagnosis not present

## 2016-02-03 DIAGNOSIS — Z98811 Dental restoration status: Secondary | ICD-10-CM | POA: Diagnosis not present

## 2016-02-03 DIAGNOSIS — Z880 Allergy status to penicillin: Secondary | ICD-10-CM | POA: Insufficient documentation

## 2016-02-03 DIAGNOSIS — F419 Anxiety disorder, unspecified: Secondary | ICD-10-CM | POA: Diagnosis not present

## 2016-02-03 DIAGNOSIS — Z87440 Personal history of urinary (tract) infections: Secondary | ICD-10-CM | POA: Insufficient documentation

## 2016-02-03 DIAGNOSIS — Y9289 Other specified places as the place of occurrence of the external cause: Secondary | ICD-10-CM | POA: Insufficient documentation

## 2016-02-03 DIAGNOSIS — F329 Major depressive disorder, single episode, unspecified: Secondary | ICD-10-CM | POA: Diagnosis not present

## 2016-02-03 DIAGNOSIS — Z8701 Personal history of pneumonia (recurrent): Secondary | ICD-10-CM | POA: Diagnosis not present

## 2016-02-03 DIAGNOSIS — K219 Gastro-esophageal reflux disease without esophagitis: Secondary | ICD-10-CM | POA: Diagnosis not present

## 2016-02-03 DIAGNOSIS — M79671 Pain in right foot: Secondary | ICD-10-CM | POA: Diagnosis not present

## 2016-02-03 DIAGNOSIS — F909 Attention-deficit hyperactivity disorder, unspecified type: Secondary | ICD-10-CM | POA: Diagnosis not present

## 2016-02-03 DIAGNOSIS — Y998 Other external cause status: Secondary | ICD-10-CM | POA: Insufficient documentation

## 2016-02-03 DIAGNOSIS — M5441 Lumbago with sciatica, right side: Secondary | ICD-10-CM

## 2016-02-03 DIAGNOSIS — G2581 Restless legs syndrome: Secondary | ICD-10-CM | POA: Insufficient documentation

## 2016-02-03 DIAGNOSIS — Z87312 Personal history of (healed) stress fracture: Secondary | ICD-10-CM | POA: Diagnosis not present

## 2016-02-03 DIAGNOSIS — Z85828 Personal history of other malignant neoplasm of skin: Secondary | ICD-10-CM | POA: Insufficient documentation

## 2016-02-03 DIAGNOSIS — W010XXA Fall on same level from slipping, tripping and stumbling without subsequent striking against object, initial encounter: Secondary | ICD-10-CM | POA: Diagnosis not present

## 2016-02-03 DIAGNOSIS — Y9389 Activity, other specified: Secondary | ICD-10-CM | POA: Diagnosis not present

## 2016-02-03 DIAGNOSIS — S79911A Unspecified injury of right hip, initial encounter: Secondary | ICD-10-CM | POA: Diagnosis present

## 2016-02-03 DIAGNOSIS — W19XXXA Unspecified fall, initial encounter: Secondary | ICD-10-CM

## 2016-02-03 NOTE — Discharge Instructions (Signed)

## 2016-02-03 NOTE — ED Notes (Signed)
Pt sts fall today and back and hand pain; pt sts some issues with right leg when standing up; pt sts hx of stress fracture of right foot

## 2016-02-03 NOTE — ED Provider Notes (Signed)
CSN: [id number]     Arrival date & time 02/03/16  1545 History   First MD Initiated Contact with Patient 02/03/16 1914     Chief Complaint  Patient presents with  . Fall      Patient is a 60 y.o. female presenting with fall. The history is provided by the patient.  Fall This is a new problem. Pertinent negatives include no chest pain, no abdominal pain and no shortness of breath.  Patient has a history of some chronic back pain. States that she was on a field trip with 60 year old today and she tripped and fell. States she's mostly wearing a boot on her right foot for a stress fracture. States she did not have it on. States she fell forward and landed on her hand and has back pain. States it radiates down her leg. She states she's had surgery on her back she is worried about it. No loss conscious. Did not hit her head. Her foot has about the same pain he had before the fall.  Past Medical History  Diagnosis Date  . Dental crown present   . ADD (attention deficit disorder)   . Bipolar disorder ([place])   . Anxiety   . Restless leg syndrome   . GERD (gastroesophageal reflux disease)   . Ureteral disorder     as a child  . Recurrent UTI   . History of pneumonia   . Wears glasses   . Depression   . Urinary frequency   . Headache   . Family history of headaches   . Arthritis   . Cancer [facility])     right leg - skin cancer   Past Surgical History  Procedure Laterality Date  . Mohs surgery Right     right leg  . Eye surgery Left July 2016    Lens Implant with Femtosecond left eye; Intraocular Lens Implant   History reviewed. No pertinent family history. Social History  Substance Use Topics  . Smoking status: Never Smoker   . Smokeless tobacco: None  . Alcohol Use: Yes     Comment: weekly   OB History    No data available     Review of Systems  Constitutional: Negative for appetite change.  Respiratory: Negative for shortness of breath.   Cardiovascular: Negative for chest  pain.  Gastrointestinal: Negative for abdominal pain.  Musculoskeletal: Positive for back pain. Negative for neck pain.  Skin: Negative for wound.  Neurological: Negative for numbness.      Allergies  Other; Procaine; Tetanus toxoids; Ciprofloxacin; Gabapentin; Penicillins; Sulfa antibiotics; Chlorhexidine; Codeine; and [place] bov[place]  Home Medications   Prior to Admission medications   Medication Sig Start Date End Date Taking? Authorizing Provider  amphetamine-dextroamphetamine (ADDERALL) 30 MG tablet Take 30 mg by mouth daily.    Historical Provider, MD  clonazePAM (KLONOPIN) 1 MG tablet Take 1 mg by mouth daily.    Historical Provider, MD  omeprazole (PRILOSEC) 20 MG capsule Take 20 mg by mouth daily.    Historical Provider, MD  pramipexole (MIRAPEX) 1.5 MG tablet Take 1.5 mg by mouth at bedtime.    Historical Provider, MD  PRESCRIPTION MEDICATION Apply 1 application topically daily. Compounded hormone cream    Historical Provider, MD  traMADol (ULTRAM) 50 MG tablet Take 1 tablet (50 mg total) by mouth every 6 (six) hours as needed for moderate pain. 06/06/15   Karie Chimera, MD   BP 128/71 mmHg  Pulse 72  Temp(Src) 97.5 F (36.4 C) (Oral)  Resp 18  SpO2 98% Physical Exam  Constitutional: She appears well-developed.  HENT:  Head: Atraumatic.  Neck: Neck supple.  Cardiovascular: Normal rate.   Pulmonary/Chest: Effort normal.  Abdominal: Soft.  Musculoskeletal: Normal range of motion. She exhibits tenderness.   tenderness in the right SI joint area. or deformity. No lumbar tenderness. Some pain with straight leg raise on right. Neurovascular intact bilateral feet. Some tenderness over right foot laterally and distally.  Neurological: She is alert.  Skin: Skin is warm.    ED Course  Procedures (including critical care time) Labs Review Labs Reviewed - No data to display  Imaging Review Dg Lumbar Spine Complete  02/03/2016  CLINICAL DATA:  60 year old female with right hip  pain after fall. 2016 lumbar surgery. Initial encounter. EXAM: LUMBAR SPINE - COMPLETE 4+ VIEW COMPARISON:  Lumbar radiographs from [place] [facility] 09/17/2015 and earlier. FINDINGS: Normal lumbar segmentation. Chronic grade 2 anterolisthesis of L5 on S1, but evidence of interbody fusion at that level. Posterior and interbody fusion hardware at L4-L5 appears stable and intact. Stable vertebral height and alignment elsewhere, including mild retrolisthesis of L2 on L3. Grossly stable sacrum. Both femoral heads are normally located. IMPRESSION: Stable postoperative appearance of the lumbar spine. Electronically Signed   By: Genevie Ann M.D.   On: 02/03/2016 16:29   Dg Hip Unilat With Pelvis 2-3 Views Right  02/03/2016  CLINICAL DATA:  Pain following fall EXAM: DG HIP (WITH OR WITHOUT PELVIS) 2-3V RIGHT COMPARISON:  None. FINDINGS: Frontal pelvis as well as frontal and lateral right hip images were obtained. There is no demonstrable fracture or dislocation. The hip joints appear normal bilaterally. There is mild osteoarthritic change in each sacroiliac joint. There is postoperative change in the lower lumbar spine region. 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 Hip joints appear symmetric and unremarkable bilaterally. No acute fracture or dislocation. Electronically Signed   By: Lowella Grip III M.D.   On: 02/03/2016 16:26   I have personally reviewed and evaluated these images and lab results as part of my medical decision-making.   EKG Interpretation None      MDM   Final diagnoses:  Fall, initial encounter  Right-sided low back pain with right-sided sciatica  Patient with fall. X-rays reassuring. Does not want pain medicine at home. Doubt acute fracture. Will discharge home.  Davonna Belling, MD 02/03/16 2011

## 2017-02-17 IMAGING — DX DG LUMBAR SPINE COMPLETE 4+V
5 series · 5 of 5 positions shown · non-contrast
Comparison: Lumbar radiographs from [[facility]OS[facility] neurosurgery
09/17/2015 and earlier.

CLINICAL DATA: 59-year-old female with right hip pain after fall.
4668 lumbar surgery. Initial encounter.

EXAM:
LUMBAR SPINE - COMPLETE 4+ VIEW

[l-spine ap]
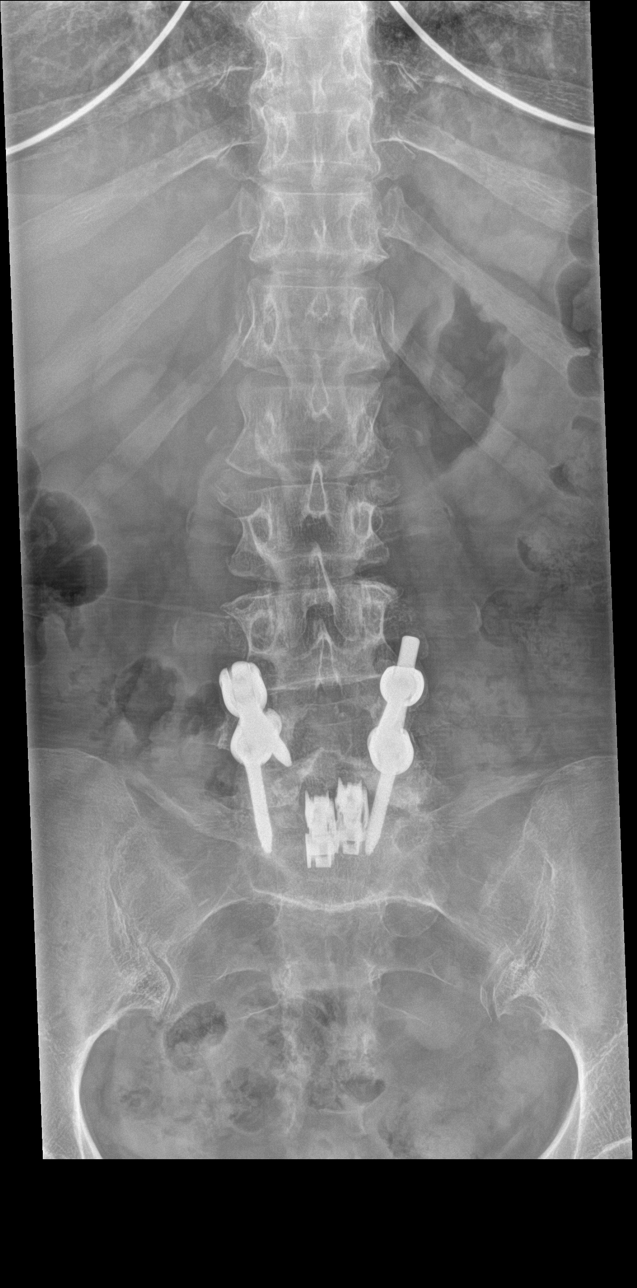

[l-spine obl (1 of 2)]
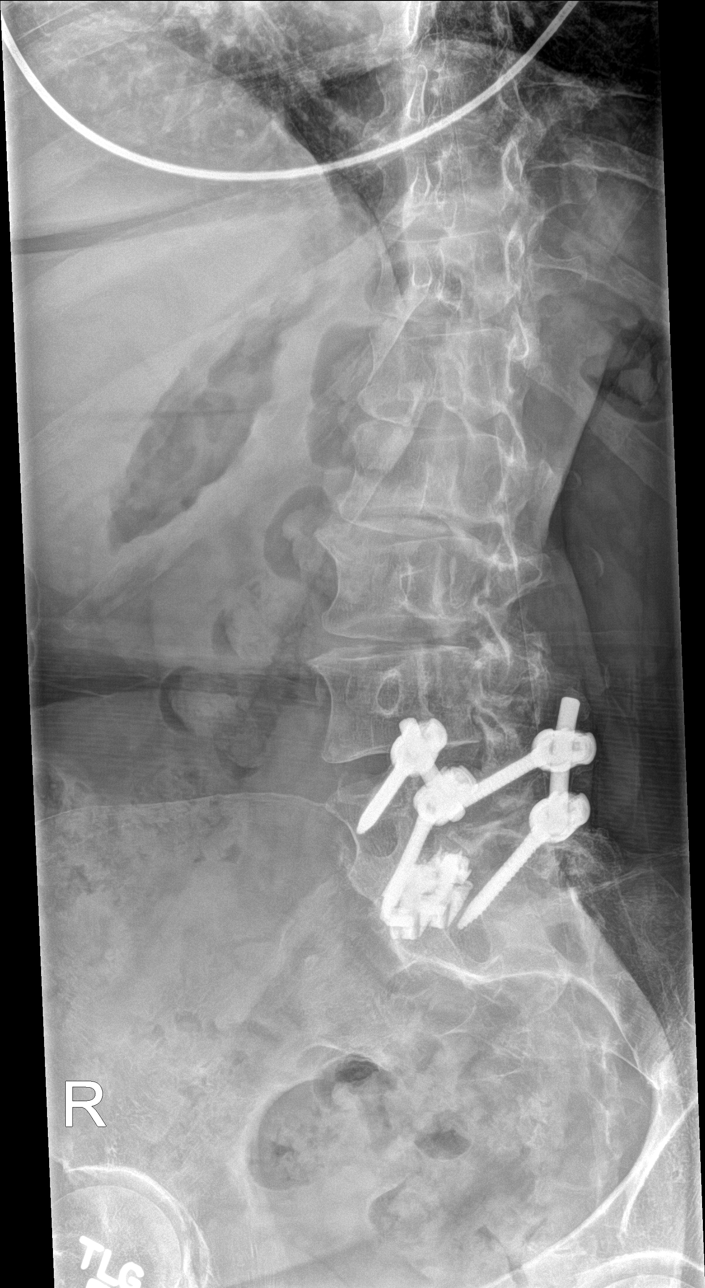

[l-spine obl (2 of 2)]
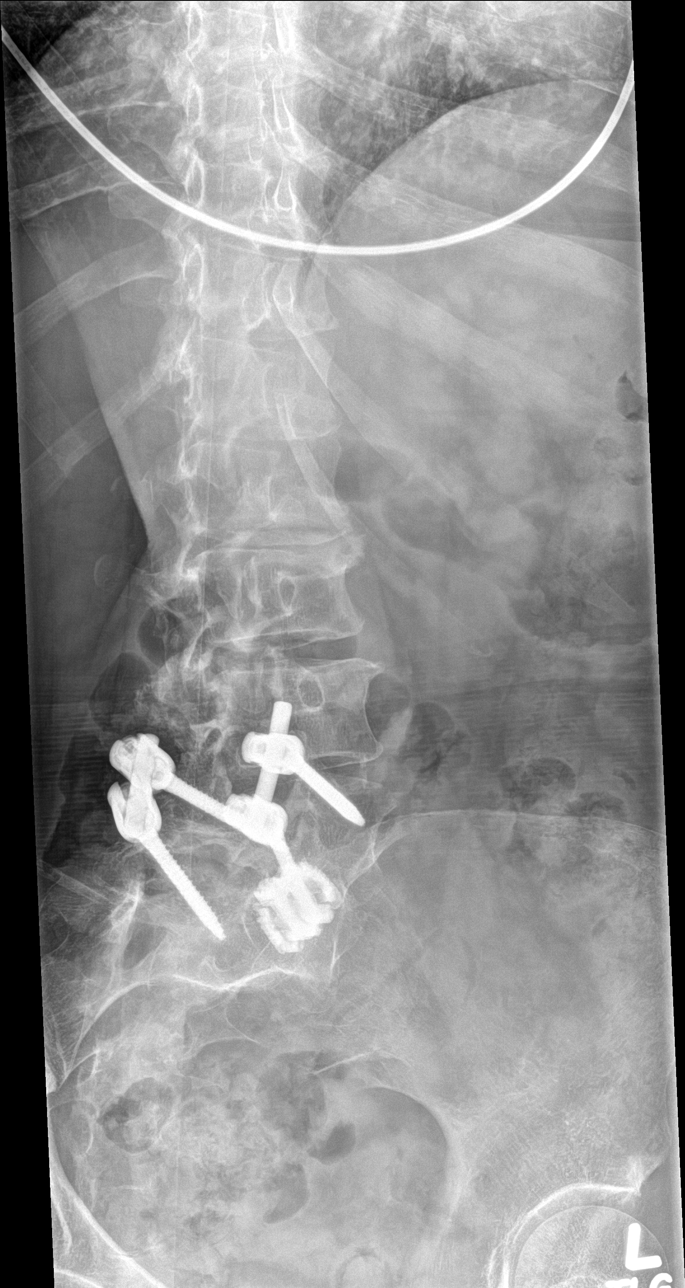

[l-spine lat]
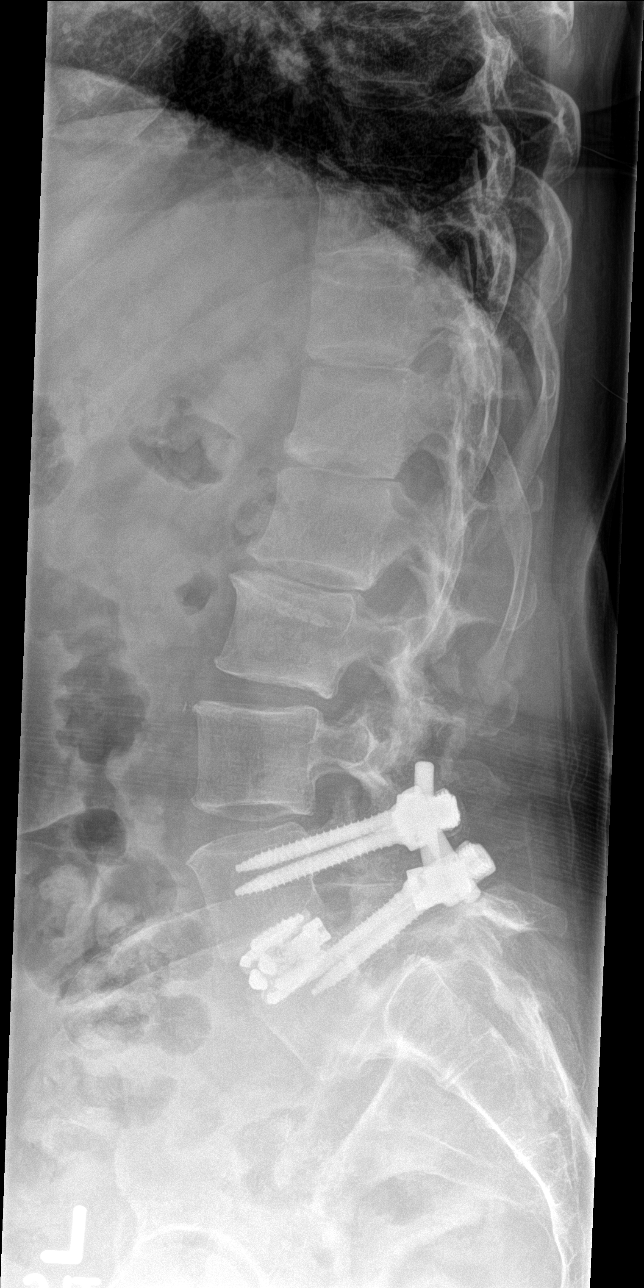

[l-spine spot]
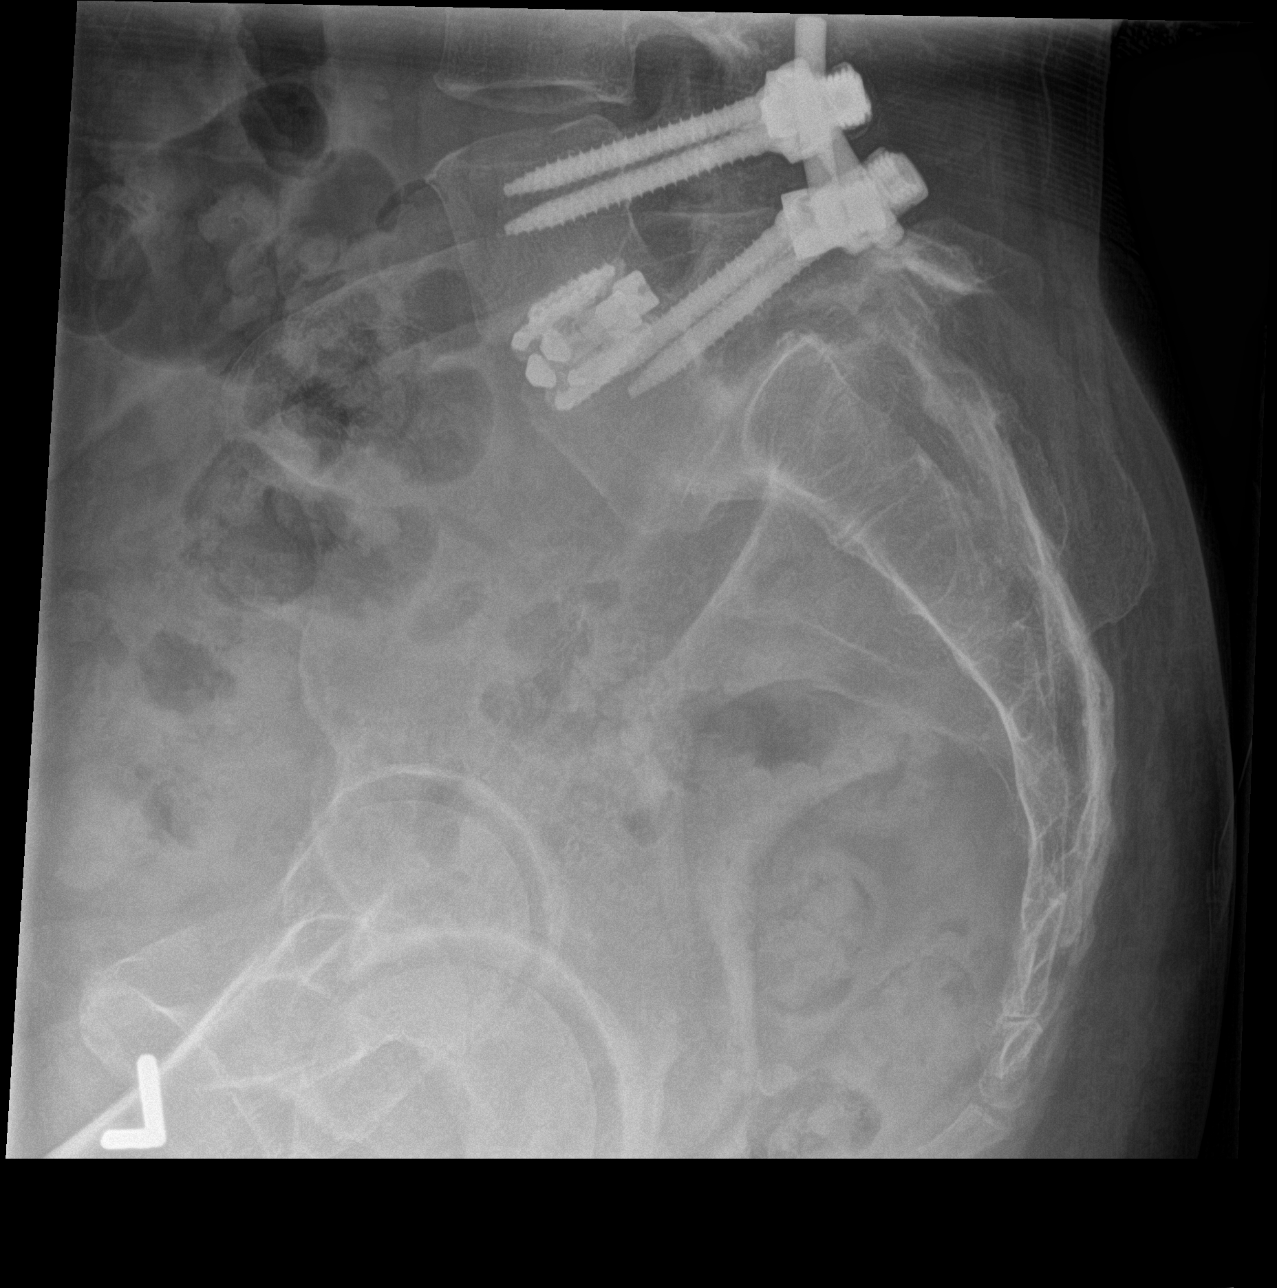

[5 of 5 positions shown; findings below may reference images not displayed]

FINDINGS: Normal lumbar segmentation. Chronic grade 2 anterolisthesis of L5 on
S1, but evidence of interbody fusion at that level. Posterior and
interbody fusion hardware at L4-L5 appears stable and intact. Stable
vertebral height and alignment elsewhere, including mild
retrolisthesis of L2 on L3. Grossly stable sacrum. Both femoral
heads are normally located.
IMPRESSION: Stable postoperative appearance of the lumbar spine.
